# Patient Record
Sex: Female | Born: 1995 | Race: White | Hispanic: No | Marital: Married
Health system: Southern US, Community
[De-identification: ages and names within clinical notes are randomized; demographics above are authoritative.]

## PROBLEM LIST (undated history)

## (undated) DIAGNOSIS — Z9889 Other specified postprocedural states: Secondary | ICD-10-CM

## (undated) DIAGNOSIS — O24419 Gestational diabetes mellitus in pregnancy, unspecified control: Secondary | ICD-10-CM

## (undated) DIAGNOSIS — E119 Type 2 diabetes mellitus without complications: Secondary | ICD-10-CM

## (undated) DIAGNOSIS — I10 Essential (primary) hypertension: Secondary | ICD-10-CM

## (undated) DIAGNOSIS — R112 Nausea with vomiting, unspecified: Secondary | ICD-10-CM

## (undated) DIAGNOSIS — E282 Polycystic ovarian syndrome: Secondary | ICD-10-CM

## (undated) DIAGNOSIS — M5020 Other cervical disc displacement, unspecified cervical region: Secondary | ICD-10-CM

## (undated) DIAGNOSIS — E039 Hypothyroidism, unspecified: Secondary | ICD-10-CM

## (undated) HISTORY — PX: OTHER SURGICAL HISTORY: SHX169

## (undated) HISTORY — PX: NO PAST SURGERIES: SHX2092

---
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
## 2018-06-04 ENCOUNTER — Encounter: Payer: 59 | Attending: Obstetrics & Gynecology | Admitting: Registered"

## 2018-06-04 DIAGNOSIS — E119 Type 2 diabetes mellitus without complications: Secondary | ICD-10-CM

## 2018-06-04 DIAGNOSIS — Z713 Dietary counseling and surveillance: Secondary | ICD-10-CM | POA: Insufficient documentation

## 2018-06-04 DIAGNOSIS — E282 Polycystic ovarian syndrome: Secondary | ICD-10-CM | POA: Insufficient documentation

## 2018-06-04 NOTE — Patient Instructions (Signed)
Consider checking your blood sugar at multiple times during the day Fasting Before dinner 2 hours after dinner 2-3 x week during the night when you wake up If you are having low blood sugar symptoms.   Aim to get more nutrition during the day, use MyPlate to help get balance  Call doctor for prescription letting her know I asked you to check 3-4x day.

## 2018-06-04 NOTE — Progress Notes (Signed)
Diabetes Self-Management Education  Visit Type: First/Initial  Appt. Start Time: 0945 Appt. End Time: 1045  06/07/2018  Melissa Mullins, identified by name and date of birth, is a 22 y.o. female with a diagnosis of Diabetes: Type 2.   ASSESSMENT Visit was originally set up visit for PCOS, trying to conceive. Pt is going to delay trying to conceive until her BG is controlled. Pt states she has been taking 500 mg of metformin since she was diagnosed with PCOS 2 yrs ago. Pt states her dose was increased to 500 mg bid as well as started Jardiance last week when diagnosed with T2DM with an A1c of 9%. Pt states she is waiting until BG gets under control before trying to conceive.  Pt states her doctor was prompted to get an A1c when her POC was 328 mg/dL. Today her blood sugar was 309 mg/dL. Pt states she ate 3/4 c cheerios and milk for breakfast several hours ago, but for the last week has only been eating 2 eggs for breakfast, with a goal of 45 g cho total for the day. Pt states she is getting an endocrinologist referral.  Sleep: wakes up frequently. Pt reports polyuria and polydipsia and states has increased with Jardiance, before was waking up 1-2x per night. ~2 months having hard time going back to sleep. Watching TV before bed. 9:30 in bed, sleep 10:30-11 pm.    Energy Level: states high in the morning 7/10 but low in the evening 4/10. Pt states she works part-time.  Pt states she uses MyFitness Pal app to track carb/pro/fat ratios. Yesterday: 1175 kcal: 80 g pro, 45 g cho, 24g sugar, 75 g fat.  Pt states she has always been larger body. Pt states she did weight watchers for 10 months when she observed her dad losing weight on it, she lost 30 lbs. Pt states she started gaining weight in college and tried [employer] again, but it did not help her lose weight. Pt states she decided to focus on improving health and has tried to maintain regular exercise and eating healthy, specifically states she aims to  consume less than 25g added sugar.  Pt states she gets light headed sometimes in the afternoon when walking around her apartment. Pt states it resolves with rest.   Meter provided: Accu-chek Guide Lot #: [id number] Exp: 01/20/19 BG: 309 mg/dL  Diabetes Self-Management Education - 06/04/18 0849      Visit Information   Visit Type  First/Initial      Initial Visit   Diabetes Type  Type 2    Are you currently following a meal plan?  Yes   45 g carbs/day   What type of meal plan do you follow?  has goal of limited carbs to 45 grams per day, less than 23 g sugar    Are you taking your medications as prescribed?  Yes    Metformin BID, Jardiance 10 mg   Date Diagnosed  Sept 2019      Psychosocial Assessment   Patient Belief/Attitude about Diabetes  Motivated to manage diabetes    How often do you need to have someone help you when you read instructions, pamphlets, or other written materials from your doctor or pharmacy?  1 - Never    What is the last grade level you completed in school?  bachelor's degree      Complications   Last HgB A1C per patient/outside source  9 %   pt provided electronic copy of A1c  How often do you check your blood sugar?  0 times/day (not testing)      Dietary Intake   Breakfast  2 eggs    Snack (morning)  vegetable (celery, PB)    Lunch  protein (chicken)    Snack (afternoon)  none OR almonds    Dinner  protein (steak), vegetable, wild rice    Snack (evening)  none    Beverage(s)  water      Exercise   Exercise Type  Moderate (swimming / aerobic walking)    How many days per week to you exercise?  4    How many minutes per day do you exercise?  45    Total minutes per week of exercise  180      Patient Education   Previous Diabetes Education  No   has been researching on internet   Disease state   Definition of diabetes, type 1 and 2, and the diagnosis of diabetes    Nutrition management   Role of diet in the treatment of diabetes and the  relationship between the three main macronutrients and blood glucose level    Physical activity and exercise   Role of exercise on diabetes management, blood pressure control and cardiac health.    Monitoring  Taught/evaluated SMBG meter.;Purpose and frequency of SMBG.    Acute complications  Taught treatment of hypoglycemia - the 15 rule.    Psychosocial adjustment  Role of stress on diabetes      Individualized Goals (developed by patient)   Nutrition  General guidelines for healthy choices and portions discussed    Medications  take my medication as prescribed   follow-up with endocrinologist   Monitoring   test my blood glucose as discussed      Outcomes   Expected Outcomes  Demonstrated interest in learning. Expect positive outcomes    Future DMSE  PRN    Program Status  Not Completed      Individualized Plan for Diabetes Self-Management Training:   Learning Objective:  Patient will have a greater understanding of diabetes self-management. Patient education plan is to attend individual and/or group sessions per assessed needs and concerns.   Patient Instructions  Consider checking your blood sugar at multiple times during the day Fasting Before dinner 2 hours after dinner 2-3 x week during the night when you wake up If you are having low blood sugar symptoms.   Aim to get more nutrition during the day, use MyPlate to help get balance  Call doctor for prescription letting her know I asked you to check 3-4x day.  Expected Outcomes:  Demonstrated interest in learning. Expect positive outcomes  Education material provided: A1C conversion sheet and My Plate  If problems or questions, patient to contact team via:  Phone  Future DSME appointment: PRN

## 2018-06-07 ENCOUNTER — Telehealth: Payer: Self-pay | Admitting: Registered"

## 2018-06-07 DIAGNOSIS — E119 Type 2 diabetes mellitus without complications: Secondary | ICD-10-CM | POA: Insufficient documentation

## 2018-06-07 NOTE — Telephone Encounter (Signed)
Called to check in with patient to make sure BG has gone down from the 300s seen during office visits. Pt states her blood sugar has has stayed around 200 since she left the office. Pt states FBG is 190-200 mg/dL, before dinner last night was 197, 2 hrs after that meal was 200 mg/dL. (chicken, asparagus, 1/2 c rice). Pt states metformin was increased to 1500 mg and will be increasing Jardiance with next refill soon. Pt states sleep has improved, has added a little more carbs but energy is still very low at night.

## 2018-07-03 ENCOUNTER — Ambulatory Visit: Payer: 59 | Admitting: Registered"

## 2018-10-01 ENCOUNTER — Other Ambulatory Visit: Payer: Self-pay | Admitting: Obstetrics & Gynecology

## 2018-10-01 DIAGNOSIS — Z3141 Encounter for fertility testing: Secondary | ICD-10-CM

## 2018-10-08 ENCOUNTER — Ambulatory Visit (HOSPITAL_COMMUNITY)
Admission: RE | Admit: 2018-10-08 | Discharge: 2018-10-08 | Disposition: A | Discharge status: Home / Self Care | Payer: PRIVATE HEALTH INSURANCE | Source: Ambulatory Visit | Attending: Obstetrics & Gynecology | Admitting: Obstetrics & Gynecology

## 2018-10-08 ENCOUNTER — Encounter (HOSPITAL_COMMUNITY): Payer: Self-pay | Admitting: Radiology

## 2018-10-08 DIAGNOSIS — Z3141 Encounter for fertility testing: Secondary | ICD-10-CM | POA: Insufficient documentation

## 2018-10-08 MED ORDER — IOPAMIDOL (ISOVUE-300) INJECTION 61%
30.0000 mL | Freq: Once | INTRAVENOUS | Status: AC | PRN
  Administered 2018-10-08: 30 mL via INTRAVENOUS

## 2019-07-05 ENCOUNTER — Encounter (INDEPENDENT_AMBULATORY_CARE_PROVIDER_SITE_OTHER): Payer: Self-pay

## 2019-10-09 ENCOUNTER — Other Ambulatory Visit: Payer: Self-pay | Admitting: Endocrinology

## 2019-10-09 DIAGNOSIS — E221 Hyperprolactinemia: Secondary | ICD-10-CM

## 2019-10-29 ENCOUNTER — Encounter: Payer: Self-pay | Admitting: Registered"

## 2019-10-29 ENCOUNTER — Other Ambulatory Visit: Payer: Self-pay

## 2019-10-29 ENCOUNTER — Encounter: Payer: BC Managed Care – PPO | Attending: Endocrinology | Admitting: Registered"

## 2019-10-29 DIAGNOSIS — E1165 Type 2 diabetes mellitus with hyperglycemia: Secondary | ICD-10-CM | POA: Diagnosis present

## 2019-10-29 NOTE — Progress Notes (Signed)
Diabetes Self-Management Education  Visit Type: First/Initial  Appt. Start Time: 1400 Appt. End Time: 1500  11/04/2019  Melissa Mullins, identified by name and date of birth, is a 24 y.o. female with a diagnosis of Diabetes: Type 2.   ASSESSMENT  There were no vitals taken for this visit. There is no height or weight on file to calculate BMI.  Patient is taking pre-natal vitamins and preparing for pregnancy. Pt reports her MD wants her blood sugar better controlled before becoming pregnant. RD discussed Ovasitol for improved ovulation and egg quality. Pt states she is scheduled for IVF treatment in ~3 weeks.  Medication: metformin, Jardiance, Levemir. RD discussed Omnipod and provided form for Representative to call her with more information.  Pt states she does not have typical hunger cues during most of the day but eats to avoid getting headaches. Pt states she is usually very hungry at dinner time.  Labs: Free Testosterone 6.7 (0.0-4.2 pg/mL); testosterone, serum 72 (8-48 ng/dL); TSH 4.58 (0.99-8.3 UIU/mL)  Diabetes Self-Management Education - 11/04/19 1359      Visit Information   Visit Type  First/Initial      Initial Visit   Diabetes Type  Type 2    Are you currently following a meal plan?  Yes    What type of meal plan do you follow?  balanced carb    Are you taking your medications as prescribed?  Yes   Jardiance, Levemir 70 u bid     Complications   Last HgB A1C per patient/outside source  7.5 %    How often do you check your blood sugar?  3-4 times/day    Fasting Blood glucose range (mg/dL)  38-250   539-767   Postprandial Blood glucose range (mg/dL)  341-937   902-409     Dietary Intake   Breakfast  coffee, sugar-free creamer, protein bar (not hungry)    Snack (morning)  apple, PB    Lunch  cereal bar (Fiber One) OR chicken, sweet potato    Dinner  Protein, starch, vegetable    Beverage(s)  water      Exercise   Exercise Type  Light (walking / raking  leaves)    How many days per week to you exercise?  7    How many minutes per day do you exercise?  45    Total minutes per week of exercise  315      Patient Education   Previous Diabetes Education  Yes (please comment)    Nutrition management   Role of diet in the treatment of diabetes and the relationship between the three main macronutrients and blood glucose level    Physical activity and exercise   Role of exercise on diabetes management, blood pressure control and cardiac health.    Medications  Reviewed patients medication for diabetes, action, purpose, timing of dose and side effects.    Preconception care  Pregnancy and GDM  Role of pre-pregnancy blood glucose control on the development of the fetus;Other (comment)   PCOS effect on pregnancy     Individualized Goals (developed by patient)   Nutrition  General guidelines for healthy choices and portions discussed    Medications  take my medication as prescribed;Other (comment)   consider Ovasitol     Outcomes   Expected Outcomes  Demonstrated interest in learning. Expect positive outcomes    Future DMSE  PRN    Program Status  Completed     Patient reports she has been  taking Iron 65 mg supplement x7 yrs and annual lab work shows iron levels WNL.    Individualized Plan for Diabetes Self-Management Training:   Learning Objective:  Patient will have a greater understanding of diabetes self-management. Patient education plan is to attend individual and/or group sessions per assessed needs and concerns.    Patient Instructions  Ask your doctor about the necessity for the iron supplement Aim to eat a diet with foods rich in iron and folate, use handout for ideas. Many women with PCOS have low vitamin D levels, consider asking if your doctor could check those levels too. Ovasitol may help with PCOS symptoms including insulin resistance. Can take while also taking metformin.  Before taking supplements, check the NIH  website: Resource for checking supplements: https://ods.CheapSyrup.pl  Consider having a snack before dinner time so you can have a smaller size dinner. Can try a snack before bedtime to see if that can help reduce your morning fasting blood sugar reading.   Expected Outcomes:  Demonstrated interest in learning. Expect positive outcomes  Education material provided: Engineer, mining, MyPlate for Moms  If problems or questions, patient to contact team via:  Phone  Future DSME appointment: PRN

## 2019-10-29 NOTE — Patient Instructions (Addendum)
Ask your doctor about the necessity for the iron supplement Aim to eat a diet with foods rich in iron and folate, use hadnout for ideas. Many women with PCOS have low vitamin D levels, consider asking if your doctor could check those levels too. Ovasitol may help with PCOS symptoms including insulin resistance. Can take while also taking metformin.  Before taking supplements, check the NIH website: Resource for checking supplements: https://ods.http://potts.com/  Consider having a snack before dinner time so you can have a smaller size dinner. Can try a snack before bedtime to see if that can help reduce your morning fasting blood sugar reading.

## 2019-10-31 ENCOUNTER — Other Ambulatory Visit: Payer: BC Managed Care – PPO

## 2019-11-25 ENCOUNTER — Ambulatory Visit
Admission: RE | Admit: 2019-11-25 | Discharge: 2019-11-25 | Disposition: A | Discharge status: Home / Self Care | Payer: BC Managed Care – PPO | Source: Ambulatory Visit | Attending: Endocrinology | Admitting: Endocrinology

## 2019-11-25 DIAGNOSIS — E221 Hyperprolactinemia: Secondary | ICD-10-CM

## 2019-11-25 MED ORDER — GADOBENATE DIMEGLUMINE 529 MG/ML IV SOLN
10.0000 mL | Freq: Once | INTRAVENOUS | Status: AC | PRN
  Administered 2019-11-25: 10 mL via INTRAVENOUS

## 2020-01-29 LAB — OB RESULTS CONSOLE ANTIBODY SCREEN: Antibody Screen: NEGATIVE

## 2020-01-29 LAB — OB RESULTS CONSOLE RUBELLA ANTIBODY, IGM: Rubella: IMMUNE

## 2020-01-29 LAB — OB RESULTS CONSOLE HIV ANTIBODY (ROUTINE TESTING): HIV: NONREACTIVE

## 2020-01-29 LAB — OB RESULTS CONSOLE GC/CHLAMYDIA
Chlamydia: NEGATIVE
Gonorrhea: NEGATIVE

## 2020-01-29 LAB — OB RESULTS CONSOLE ABO/RH: RH Type: POSITIVE

## 2020-01-29 LAB — OB RESULTS CONSOLE RPR: RPR: NONREACTIVE

## 2020-01-29 LAB — OB RESULTS CONSOLE HEPATITIS B SURFACE ANTIGEN: Hepatitis B Surface Ag: NEGATIVE

## 2020-07-13 DIAGNOSIS — Z3A32 32 weeks gestation of pregnancy: Secondary | ICD-10-CM | POA: Diagnosis not present

## 2020-07-13 DIAGNOSIS — O24113 Pre-existing diabetes mellitus, type 2, in pregnancy, third trimester: Secondary | ICD-10-CM | POA: Diagnosis not present

## 2020-07-16 DIAGNOSIS — O99891 Other specified diseases and conditions complicating pregnancy: Secondary | ICD-10-CM | POA: Diagnosis not present

## 2020-07-20 DIAGNOSIS — Z3A33 33 weeks gestation of pregnancy: Secondary | ICD-10-CM | POA: Diagnosis not present

## 2020-07-20 DIAGNOSIS — O99891 Other specified diseases and conditions complicating pregnancy: Secondary | ICD-10-CM | POA: Diagnosis not present

## 2020-07-23 DIAGNOSIS — Z3A33 33 weeks gestation of pregnancy: Secondary | ICD-10-CM | POA: Diagnosis not present

## 2020-07-23 DIAGNOSIS — O24913 Unspecified diabetes mellitus in pregnancy, third trimester: Secondary | ICD-10-CM | POA: Diagnosis not present

## 2020-07-27 DIAGNOSIS — O24913 Unspecified diabetes mellitus in pregnancy, third trimester: Secondary | ICD-10-CM | POA: Diagnosis not present

## 2020-07-27 DIAGNOSIS — Z3A34 34 weeks gestation of pregnancy: Secondary | ICD-10-CM | POA: Diagnosis not present

## 2020-07-30 DIAGNOSIS — O99891 Other specified diseases and conditions complicating pregnancy: Secondary | ICD-10-CM | POA: Diagnosis not present

## 2020-07-30 DIAGNOSIS — Z3A34 34 weeks gestation of pregnancy: Secondary | ICD-10-CM | POA: Diagnosis not present

## 2020-08-03 DIAGNOSIS — Z3A35 35 weeks gestation of pregnancy: Secondary | ICD-10-CM | POA: Diagnosis not present

## 2020-08-03 DIAGNOSIS — O24119 Pre-existing diabetes mellitus, type 2, in pregnancy, unspecified trimester: Secondary | ICD-10-CM | POA: Diagnosis not present

## 2020-08-04 DIAGNOSIS — E02 Subclinical iodine-deficiency hypothyroidism: Secondary | ICD-10-CM | POA: Diagnosis not present

## 2020-08-04 DIAGNOSIS — Z331 Pregnant state, incidental: Secondary | ICD-10-CM | POA: Diagnosis not present

## 2020-08-04 DIAGNOSIS — E1165 Type 2 diabetes mellitus with hyperglycemia: Secondary | ICD-10-CM | POA: Diagnosis not present

## 2020-08-04 DIAGNOSIS — Z3685 Encounter for antenatal screening for Streptococcus B: Secondary | ICD-10-CM | POA: Diagnosis not present

## 2020-08-04 LAB — OB RESULTS CONSOLE GBS: GBS: NEGATIVE

## 2020-08-07 ENCOUNTER — Encounter (HOSPITAL_COMMUNITY): Payer: Self-pay | Admitting: Obstetrics & Gynecology

## 2020-08-07 ENCOUNTER — Inpatient Hospital Stay (HOSPITAL_COMMUNITY)
Admission: AD | Admit: 2020-08-07 | Discharge: 2020-08-07 | Disposition: A | Discharge status: Home / Self Care | Payer: BC Managed Care – PPO | Attending: Obstetrics & Gynecology | Admitting: Obstetrics & Gynecology

## 2020-08-07 ENCOUNTER — Other Ambulatory Visit: Payer: Self-pay

## 2020-08-07 DIAGNOSIS — Z3A36 36 weeks gestation of pregnancy: Secondary | ICD-10-CM | POA: Diagnosis not present

## 2020-08-07 DIAGNOSIS — Z3689 Encounter for other specified antenatal screening: Secondary | ICD-10-CM

## 2020-08-07 DIAGNOSIS — O24113 Pre-existing diabetes mellitus, type 2, in pregnancy, third trimester: Secondary | ICD-10-CM | POA: Insufficient documentation

## 2020-08-07 DIAGNOSIS — Z794 Long term (current) use of insulin: Secondary | ICD-10-CM | POA: Diagnosis not present

## 2020-08-07 NOTE — Discharge Instructions (Signed)
Fetal Movement Counts Patient Name: ________________________________________________ Patient Due Date: ____________________ What is a fetal movement count?  A fetal movement count is the number of times that you feel your baby move during a certain amount of time. This may also be called a fetal kick count. A fetal movement count is recommended for every pregnant woman. You may be asked to start counting fetal movements as early as week 28 of your pregnancy. Pay attention to when your baby is most active. You may notice your baby's sleep and wake cycles. You may also notice things that make your baby move more. You should do a fetal movement count:  When your baby is normally most active.  At the same time each day. A good time to count movements is while you are resting, after having something to eat and drink. How do I count fetal movements? 1. Find a quiet, comfortable area. Sit, or lie down on your side. 2. Write down the date, the start time and stop time, and the number of movements that you felt between those two times. Take this information with you to your health care visits. 3. Write down your start time when you feel the first movement. 4. Count kicks, flutters, swishes, rolls, and jabs. You should feel at least 10 movements. 5. You may stop counting after you have felt 10 movements, or if you have been counting for 2 hours. Write down the stop time. 6. If you do not feel 10 movements in 2 hours, contact your health care provider for further instructions. Your health care provider may want to do additional tests to assess your baby's well-being. Contact a health care provider if:  You feel fewer than 10 movements in 2 hours.  Your baby is not moving like he or she usually does. Date: ____________ Start time: ____________ Stop time: ____________ Movements: ____________ Date: ____________ Start time: ____________ Stop time: ____________ Movements: ____________ Date: ____________  Start time: ____________ Stop time: ____________ Movements: ____________ Date: ____________ Start time: ____________ Stop time: ____________ Movements: ____________ Date: ____________ Start time: ____________ Stop time: ____________ Movements: ____________ Date: ____________ Start time: ____________ Stop time: ____________ Movements: ____________ Date: ____________ Start time: ____________ Stop time: ____________ Movements: ____________ Date: ____________ Start time: ____________ Stop time: ____________ Movements: ____________ Date: ____________ Start time: ____________ Stop time: ____________ Movements: ____________ This information is not intended to replace advice given to you by your health care provider. Make sure you discuss any questions you have with your health care provider. Document Revised: 04/18/2019 Document Reviewed: 04/18/2019 Elsevier Patient Education  2020 [employer].  

## 2020-08-07 NOTE — MAU Provider Note (Signed)
First Provider Initiated Contact with Patient 08/07/20 [phone]       S: Ms. Margreat Widener is a 24 y.o. G1P0 at [redacted]w[redacted]d  who presents to MAU today for NST since her OB office is closed for the holiday. She denies ctx, LOF, or VB. Reports good FM. Her pregnancy is complicated by T2DM on Insulin, IVF pregnancy, and Hypothyroidism.  O: BP 131/74   Pulse 86   Temp (!) 97.5 F (36.4 C) (Oral)   Resp 18  GENERAL: Well-developed, well-nourished female in no acute distress.  HEAD: Normocephalic, atraumatic.  CHEST: Normal effort of breathing, regular heart rate ABDOMEN: Soft, nontender, gravid  Fetal Monitoring: Baseline: 140 Variability: mod Accelerations: + Decelerations: no Contractions: none  A: 1. [redacted] weeks gestation of pregnancy   2. NST (non-stress test) reactive   3. Pregnancy with type 2 diabetes mellitus in third trimester    P: Discharge home Follow up at [facility] as scheduled FMCs  Allergies as of 08/07/2020      Reactions   Penicillins    Sulfa Antibiotics       Medication List    TAKE these medications   Jardiance 10 MG Tabs tablet Generic drug: empagliflozin Take 10 mg by mouth daily.   LEVEMIR FLEXPEN [place] Inject 70 Units into the skin in the morning and at bedtime.   levothyroxine 25 MCG tablet Commonly known as: SYNTHROID Take 75 mcg by mouth daily before breakfast.   metFORMIN 500 MG tablet Commonly known as: GLUCOPHAGE Take by mouth 2 (two) times daily with a meal.   multivitamin-prenatal 27-0.8 MG Tabs tablet Take 1 tablet by mouth daily at 12 noon.      Donette Larry, CNM 08/07/2020 8:48 AM

## 2020-08-07 NOTE — MAU Note (Signed)
Pt getting bi-weekly NST for type 2 DM.  No complaints.

## 2020-08-07 NOTE — Progress Notes (Signed)
Office closed due to holiday.  Pt has been getting bi-weekly NST due to Type 2 DM. No complaints.  Reports +FM

## 2020-08-09 ENCOUNTER — Encounter (HOSPITAL_COMMUNITY): Payer: Self-pay | Admitting: Obstetrics & Gynecology

## 2020-08-09 ENCOUNTER — Other Ambulatory Visit: Payer: Self-pay

## 2020-08-09 ENCOUNTER — Inpatient Hospital Stay (HOSPITAL_COMMUNITY)
Admission: AD | Admit: 2020-08-09 | Discharge: 2020-08-09 | Disposition: A | Discharge status: Home / Self Care | Payer: BC Managed Care – PPO | Attending: Obstetrics & Gynecology | Admitting: Obstetrics & Gynecology

## 2020-08-09 DIAGNOSIS — Z7984 Long term (current) use of oral hypoglycemic drugs: Secondary | ICD-10-CM | POA: Diagnosis not present

## 2020-08-09 DIAGNOSIS — Z3A00 Weeks of gestation of pregnancy not specified: Secondary | ICD-10-CM | POA: Diagnosis not present

## 2020-08-09 DIAGNOSIS — R030 Elevated blood-pressure reading, without diagnosis of hypertension: Secondary | ICD-10-CM

## 2020-08-09 DIAGNOSIS — Z79890 Hormone replacement therapy (postmenopausal): Secondary | ICD-10-CM | POA: Diagnosis not present

## 2020-08-09 DIAGNOSIS — O24113 Pre-existing diabetes mellitus, type 2, in pregnancy, third trimester: Secondary | ICD-10-CM | POA: Diagnosis not present

## 2020-08-09 DIAGNOSIS — Z3A36 36 weeks gestation of pregnancy: Secondary | ICD-10-CM | POA: Diagnosis not present

## 2020-08-09 DIAGNOSIS — O99891 Other specified diseases and conditions complicating pregnancy: Secondary | ICD-10-CM

## 2020-08-09 DIAGNOSIS — Z794 Long term (current) use of insulin: Secondary | ICD-10-CM | POA: Insufficient documentation

## 2020-08-09 DIAGNOSIS — O133 Gestational [pregnancy-induced] hypertension without significant proteinuria, third trimester: Secondary | ICD-10-CM | POA: Insufficient documentation

## 2020-08-09 HISTORY — DX: Essential (primary) hypertension: I10

## 2020-08-09 HISTORY — DX: Type 2 diabetes mellitus without complications: E11.9

## 2020-08-09 LAB — URINALYSIS, ROUTINE W REFLEX MICROSCOPIC
Bilirubin Urine: NEGATIVE
Glucose, UA: NEGATIVE mg/dL
Hgb urine dipstick: NEGATIVE
Ketones, ur: NEGATIVE mg/dL
Leukocytes,Ua: NEGATIVE
Nitrite: NEGATIVE
Protein, ur: NEGATIVE mg/dL
Specific Gravity, Urine: 1.017 (ref 1.005–1.030)
pH: 5 (ref 5.0–8.0)

## 2020-08-09 LAB — CBC
HCT: 36.7 % (ref 36.0–46.0)
Hemoglobin: 12.3 g/dL (ref 12.0–15.0)
MCH: 29.9 pg (ref 26.0–34.0)
MCHC: 33.5 g/dL (ref 30.0–36.0)
MCV: 89.1 fL (ref 80.0–100.0)
Platelets: 262 10*3/uL (ref 150–400)
RBC: 4.12 MIL/uL (ref 3.87–5.11)
RDW: 13.4 % (ref 11.5–15.5)
WBC: 11.9 10*3/uL — ABNORMAL HIGH (ref 4.0–10.5)
nRBC: 0 % (ref 0.0–0.2)

## 2020-08-09 LAB — COMPREHENSIVE METABOLIC PANEL
ALT: 35 U/L (ref 0–44)
AST: 24 U/L (ref 15–41)
Albumin: 2.6 g/dL — ABNORMAL LOW (ref 3.5–5.0)
Alkaline Phosphatase: 78 U/L (ref 38–126)
Anion gap: 10 (ref 5–15)
BUN: 7 mg/dL (ref 6–20)
CO2: 24 mmol/L (ref 22–32)
Calcium: 8.9 mg/dL (ref 8.9–10.3)
Chloride: 104 mmol/L (ref 98–111)
Creatinine, Ser: 0.9 mg/dL (ref 0.44–1.00)
GFR, Estimated: >60 mL/min (ref >60)
Glucose, Bld: 166 mg/dL — ABNORMAL HIGH (ref 70–99)
Potassium: 3.7 mmol/L (ref 3.5–5.1)
Sodium: 138 mmol/L (ref 135–145)
Total Bilirubin: 0.3 mg/dL (ref 0.3–1.2)
Total Protein: 6.2 g/dL — ABNORMAL LOW (ref 6.5–8.1)

## 2020-08-09 LAB — PROTEIN / CREATININE RATIO, URINE
Creatinine, Urine: 149.59 mg/dL
Protein Creatinine Ratio: 0.05 mg/mg{Cre} (ref 0.00–0.15)
Total Protein, Urine: 7 mg/dL

## 2020-08-09 NOTE — MAU Provider Note (Signed)
Patient Melissa Mullins is a 24 y.o.  G1P0  at G1P0 here with complaints of elevated BP reading at home x 1. She denies blurry vision, HA, floating spots, decreased fetal movement, LOF, contractions, RUQ pain.   She is a Type 2 DM; has been receiving weekly NST at [facility]. She reports that she was on her feet today and she took her BP, and it was elevated.  History     CSN: [id number]  Arrival date and time: 08/09/20 2034   None     Chief Complaint  Patient presents with  . Hypertension   Hypertension This is a new problem. The current episode started today. The problem is unchanged. Pertinent negatives include no blurred vision, chest pain, headaches or shortness of breath.  She reports that her BP at home was 148/107. She denies any history of blood pressure issues in this pregnancy. She took her BP "just to see" what it is and she thinks its elevated because she was on her feet a lot today.   OB History    Gravida  1   Para      Term      Preterm      AB      Living        SAB      TAB      Ectopic      Multiple      Live Births              Past Medical History:  Diagnosis Date  . Diabetes mellitus without complication (HCC)    Type 2 Diagnosed in 2019  . Hypertension    Pt reports intermittent elevation-not severe range following BP;s at home x 2 weeks    Past Surgical History:  Procedure Laterality Date  . NO PAST SURGERIES      History reviewed. No pertinent family history.  Social History   Tobacco Use  . Smoking status: Never Smoker  . Smokeless tobacco: Never Used  Substance Use Topics  . Alcohol use: Never  . Drug use: Never    Allergies:  Allergies  Allergen Reactions  . Penicillins Hives    Pt reports as a child has avoided drug since  . Sulfa Antibiotics Hives    Pt reports reaction as a child-has avoided medication since    Medications Prior to Admission  Medication Sig Dispense Refill Last Dose  . insulin  aspart (NOVOLOG) 100 UNIT/ML injection Inject 30 Units into the skin 3 (three) times daily before meals.   08/09/2020 at 1830  . Insulin Detemir (LEVEMIR FLEXPEN [place]) Inject 65 Units into the skin in the morning.    08/09/2020 at Unknown time  . insulin detemir (LEVEMIR) 100 UNIT/ML injection Inject 40 Units into the skin at bedtime. If BS less than 100 pt to hold Levemir if above 100 takes 40units   08/08/2020 at Unknown time  . levothyroxine (SYNTHROID, LEVOTHROID) 25 MCG tablet Take 75 mcg by mouth daily before breakfast.    08/09/2020 at Unknown time  . metFORMIN (GLUCOPHAGE) 500 MG tablet Take 1,000 mg by mouth 2 (two) times daily with a meal.    08/09/2020 at 1930  . Prenatal Vit-Fe Fumarate-FA (MULTIVITAMIN-PRENATAL) 27-0.8 MG TABS tablet Take 1 tablet by mouth daily at 12 noon.   08/08/2020 at Unknown time  . empagliflozin (JARDIANCE) 10 MG TABS tablet Take 10 mg by mouth daily.       Review of Systems  Eyes: Negative  for blurred vision.  Respiratory: Negative for shortness of breath.   Cardiovascular: Negative for chest pain.  Neurological: Negative for headaches.   Physical Exam   Blood pressure (!) 146/83, pulse 79, temperature 98.4 F (36.9 C), resp. rate 18, height 5\' 2"  (1.575 m), weight 105.2 kg.  Physical Exam Constitutional:      Appearance: Normal appearance.  Pulmonary:     Effort: Pulmonary effort is normal.  Abdominal:     General: Abdomen is flat.  Musculoskeletal:        General: Normal range of motion.  Neurological:     Mental Status: She is alert.     MAU Course  Procedures  MDM -NST 135 bpm, mod var, present acel, neg decels, no contractions  -CBC, CMP are reassuring, no indiciation of pre-e or pre-e with severe features -PCR: 0.05  Patient Vitals for the past 24 hrs:  BP Temp Temp src Pulse Resp SpO2 Height Weight  08/09/20 2343 - 98.3 F (36.8 C) Oral - - - - -  08/09/20 2326 - - - - - 100 % - -  08/09/20 2321 - - - - - 100 % - -  08/09/20  2317 117/75 - - 84 - - - -  08/09/20 2316 - - - - - 99 % - -  08/09/20 2311 - - - - - 100 % - -  08/09/20 2306 - - - - - 99 % - -  08/09/20 2302 118/68 - - 91 - - - -  08/09/20 2301 - - - - - 99 % - -  08/09/20 2256 - - - - - 100 % - -  08/09/20 2251 - - - - - 100 % - -  08/09/20 2141 - - - - - 99 % - -  08/09/20 2136 - - - - - 98 % - -  08/09/20 2131 131/82 - - 89 17 97 % - -  08/09/20 2126 - - - - - 98 % - -  08/09/20 2121 - - - - - 97 % - -  08/09/20 2116 136/80 - - 82 - 97 % - -  08/09/20 2111 128/79 98.6 F (37 C) Oral 84 17 97 % - -  08/09/20 2055 (!) 146/83 98.4 F (36.9 C) - 79 18 - 5\' 2"  (1.575 m) 105.2 kg   Blood pressure taken over many hours while in MAU; had one elevated Bp upon arrival and then none after that. Explained to patient that she might meet criteria for GHT/pre-e if she continues to have elevated BPs  Assessment and Plan   1. Transient hypertension    -Patient has appt on Tuesday; will check BP again on Tuesday at that visit.  -reviewed warning signs and when to return to MAU, including signs of pre-eclampsia (HA, blurry vision, elevated blood pressure at home, RUQ pain, floating spots) -Dr. Tuesday aware of patient's MAU visit and that patient will follow up in the office.   Thursday Kooistra 08/09/2020, 9:39 PM

## 2020-08-09 NOTE — MAU Note (Signed)
Pt denies HA, visual changes or epigastric pain. Pt is Type 2 Diabetic diagnosed in 2019. Pt was seen in the office last Monday for ultrasound for growth. Dr.Tomblin to repeat US on Tuesday to follow AF level. SVE in office. /closed and soft per pt report.

## 2020-08-09 NOTE — Discharge Instructions (Signed)

## 2020-08-09 NOTE — MAU Note (Signed)
Pt took b/p tonight and it was 148/107. Has GDM and has NST biweekly. Good fetal movment reported. No headache tonight but has been having headaches  off and on all week

## 2020-08-11 DIAGNOSIS — O24113 Pre-existing diabetes mellitus, type 2, in pregnancy, third trimester: Secondary | ICD-10-CM | POA: Diagnosis not present

## 2020-08-11 DIAGNOSIS — Z3A36 36 weeks gestation of pregnancy: Secondary | ICD-10-CM | POA: Diagnosis not present

## 2020-08-13 ENCOUNTER — Telehealth (HOSPITAL_COMMUNITY): Payer: Self-pay | Admitting: *Deleted

## 2020-08-13 ENCOUNTER — Other Ambulatory Visit (HOSPITAL_COMMUNITY)
Admission: RE | Admit: 2020-08-13 | Discharge: 2020-08-13 | Disposition: A | Discharge status: Home / Self Care | Payer: BC Managed Care – PPO | Source: Ambulatory Visit | Attending: Obstetrics and Gynecology | Admitting: Obstetrics and Gynecology

## 2020-08-13 DIAGNOSIS — Z3A37 37 weeks gestation of pregnancy: Secondary | ICD-10-CM | POA: Diagnosis not present

## 2020-08-13 DIAGNOSIS — E119 Type 2 diabetes mellitus without complications: Secondary | ICD-10-CM | POA: Diagnosis not present

## 2020-08-13 DIAGNOSIS — O134 Gestational [pregnancy-induced] hypertension without significant proteinuria, complicating childbirth: Secondary | ICD-10-CM | POA: Diagnosis not present

## 2020-08-13 DIAGNOSIS — O2412 Pre-existing diabetes mellitus, type 2, in childbirth: Secondary | ICD-10-CM | POA: Diagnosis not present

## 2020-08-13 DIAGNOSIS — O99284 Endocrine, nutritional and metabolic diseases complicating childbirth: Secondary | ICD-10-CM | POA: Diagnosis not present

## 2020-08-13 DIAGNOSIS — Z20822 Contact with and (suspected) exposure to covid-19: Secondary | ICD-10-CM | POA: Diagnosis not present

## 2020-08-13 DIAGNOSIS — O99891 Other specified diseases and conditions complicating pregnancy: Secondary | ICD-10-CM | POA: Diagnosis not present

## 2020-08-13 DIAGNOSIS — Z23 Encounter for immunization: Secondary | ICD-10-CM | POA: Diagnosis not present

## 2020-08-13 DIAGNOSIS — O4103X0 Oligohydramnios, third trimester, not applicable or unspecified: Secondary | ICD-10-CM | POA: Diagnosis not present

## 2020-08-13 DIAGNOSIS — E039 Hypothyroidism, unspecified: Secondary | ICD-10-CM | POA: Diagnosis not present

## 2020-08-13 DIAGNOSIS — Z794 Long term (current) use of insulin: Secondary | ICD-10-CM | POA: Diagnosis not present

## 2020-08-13 DIAGNOSIS — Z3A36 36 weeks gestation of pregnancy: Secondary | ICD-10-CM | POA: Diagnosis not present

## 2020-08-13 DIAGNOSIS — Z01812 Encounter for preprocedural laboratory examination: Secondary | ICD-10-CM | POA: Insufficient documentation

## 2020-08-13 DIAGNOSIS — Z833 Family history of diabetes mellitus: Secondary | ICD-10-CM | POA: Diagnosis not present

## 2020-08-13 DIAGNOSIS — Z0542 Observation and evaluation of newborn for suspected metabolic condition ruled out: Secondary | ICD-10-CM | POA: Diagnosis not present

## 2020-08-13 DIAGNOSIS — O99214 Obesity complicating childbirth: Secondary | ICD-10-CM | POA: Diagnosis not present

## 2020-08-13 LAB — SARS CORONAVIRUS 2 (TAT 6-24 HRS): SARS Coronavirus 2: NEGATIVE

## 2020-08-13 NOTE — Telephone Encounter (Signed)
Preadmission screen  

## 2020-08-14 ENCOUNTER — Inpatient Hospital Stay (HOSPITAL_COMMUNITY): Payer: BC Managed Care – PPO

## 2020-08-14 ENCOUNTER — Inpatient Hospital Stay (HOSPITAL_COMMUNITY)
Admission: AD | Admit: 2020-08-14 | Discharge: 2020-08-18 | DRG: 787 | Disposition: A | Discharge status: Home / Self Care | Payer: BC Managed Care – PPO | Attending: Obstetrics and Gynecology | Admitting: Obstetrics and Gynecology

## 2020-08-14 ENCOUNTER — Inpatient Hospital Stay (HOSPITAL_COMMUNITY): Payer: BC Managed Care – PPO | Admitting: Anesthesiology

## 2020-08-14 ENCOUNTER — Other Ambulatory Visit: Payer: Self-pay

## 2020-08-14 ENCOUNTER — Encounter (HOSPITAL_COMMUNITY)
Admission: AD | Disposition: A | Discharge status: Home / Self Care | Payer: Self-pay | Source: Home / Self Care | Attending: Obstetrics and Gynecology

## 2020-08-14 ENCOUNTER — Encounter (HOSPITAL_COMMUNITY): Payer: Self-pay | Admitting: Obstetrics and Gynecology

## 2020-08-14 DIAGNOSIS — O2412 Pre-existing diabetes mellitus, type 2, in childbirth: Secondary | ICD-10-CM | POA: Diagnosis present

## 2020-08-14 DIAGNOSIS — Z20822 Contact with and (suspected) exposure to covid-19: Secondary | ICD-10-CM | POA: Diagnosis present

## 2020-08-14 DIAGNOSIS — Z3A37 37 weeks gestation of pregnancy: Secondary | ICD-10-CM | POA: Diagnosis not present

## 2020-08-14 DIAGNOSIS — O4103X0 Oligohydramnios, third trimester, not applicable or unspecified: Secondary | ICD-10-CM | POA: Diagnosis present

## 2020-08-14 DIAGNOSIS — O99214 Obesity complicating childbirth: Secondary | ICD-10-CM | POA: Diagnosis present

## 2020-08-14 DIAGNOSIS — E039 Hypothyroidism, unspecified: Secondary | ICD-10-CM | POA: Diagnosis present

## 2020-08-14 DIAGNOSIS — O99284 Endocrine, nutritional and metabolic diseases complicating childbirth: Secondary | ICD-10-CM | POA: Diagnosis present

## 2020-08-14 DIAGNOSIS — E119 Type 2 diabetes mellitus without complications: Secondary | ICD-10-CM | POA: Diagnosis present

## 2020-08-14 DIAGNOSIS — Z3490 Encounter for supervision of normal pregnancy, unspecified, unspecified trimester: Secondary | ICD-10-CM | POA: Diagnosis present

## 2020-08-14 DIAGNOSIS — O134 Gestational [pregnancy-induced] hypertension without significant proteinuria, complicating childbirth: Principal | ICD-10-CM | POA: Diagnosis present

## 2020-08-14 DIAGNOSIS — Z794 Long term (current) use of insulin: Secondary | ICD-10-CM

## 2020-08-14 DIAGNOSIS — Z98891 History of uterine scar from previous surgery: Secondary | ICD-10-CM

## 2020-08-14 LAB — GLUCOSE, CAPILLARY
Glucose-Capillary: 87 mg/dL (ref 70–99)
Glucose-Capillary: 96 mg/dL (ref 70–99)

## 2020-08-14 LAB — TYPE AND SCREEN
ABO/RH(D): AB POS
Antibody Screen: NEGATIVE

## 2020-08-14 LAB — COMPREHENSIVE METABOLIC PANEL
ALT: 27 U/L (ref 0–44)
AST: 21 U/L (ref 15–41)
Albumin: 2.7 g/dL — ABNORMAL LOW (ref 3.5–5.0)
Alkaline Phosphatase: 89 U/L (ref 38–126)
Anion gap: 9 (ref 5–15)
BUN: 6 mg/dL (ref 6–20)
CO2: 22 mmol/L (ref 22–32)
Calcium: 8.7 mg/dL — ABNORMAL LOW (ref 8.9–10.3)
Chloride: 106 mmol/L (ref 98–111)
Creatinine, Ser: 0.74 mg/dL (ref 0.44–1.00)
GFR, Estimated: >60 mL/min (ref >60)
Glucose, Bld: 110 mg/dL — ABNORMAL HIGH (ref 70–99)
Potassium: 3.7 mmol/L (ref 3.5–5.1)
Sodium: 137 mmol/L (ref 135–145)
Total Bilirubin: 0.4 mg/dL (ref 0.3–1.2)
Total Protein: 6.2 g/dL — ABNORMAL LOW (ref 6.5–8.1)

## 2020-08-14 LAB — PROTEIN / CREATININE RATIO, URINE
Creatinine, Urine: 55.59 mg/dL
Protein Creatinine Ratio: 0.18 mg/mg{Cre} — ABNORMAL HIGH (ref 0.00–0.15)
Total Protein, Urine: 10 mg/dL

## 2020-08-14 LAB — CBC
HCT: 40.4 % (ref 36.0–46.0)
HCT: 38.3 % (ref 36.0–46.0)
Hemoglobin: 13.4 g/dL (ref 12.0–15.0)
Hemoglobin: 12.8 g/dL (ref 12.0–15.0)
MCH: 30 pg (ref 26.0–34.0)
MCH: 30 pg (ref 26.0–34.0)
MCHC: 33.2 g/dL (ref 30.0–36.0)
MCHC: 33.4 g/dL (ref 30.0–36.0)
MCV: 90.6 fL (ref 80.0–100.0)
MCV: 89.9 fL (ref 80.0–100.0)
Platelets: 275 10*3/uL (ref 150–400)
Platelets: 254 10*3/uL (ref 150–400)
RBC: 4.46 MIL/uL (ref 3.87–5.11)
RBC: 4.26 MIL/uL (ref 3.87–5.11)
RDW: 13.6 % (ref 11.5–15.5)
RDW: 13.5 % (ref 11.5–15.5)
WBC: 11.7 10*3/uL — ABNORMAL HIGH (ref 4.0–10.5)
WBC: 10.5 10*3/uL (ref 4.0–10.5)
nRBC: 0 % (ref 0.0–0.2)
nRBC: 0 % (ref 0.0–0.2)

## 2020-08-14 LAB — HEMOGLOBIN A1C
Hgb A1c MFr Bld: 4.8 % (ref 4.8–5.6)
Mean Plasma Glucose: 91.06 mg/dL

## 2020-08-14 LAB — RPR: RPR Ser Ql: NONREACTIVE

## 2020-08-14 SURGERY — Surgical Case
Anesthesia: Spinal

## 2020-08-14 MED ORDER — PHENYLEPHRINE 40 MCG/ML (10ML) SYRINGE FOR IV PUSH (FOR BLOOD PRESSURE SUPPORT): 80.0000 ug | PREFILLED_SYRINGE | INTRAVENOUS | Status: DC | PRN

## 2020-08-14 MED ORDER — OXYTOCIN-SODIUM CHLORIDE 30-0.9 UT/500ML-% IV SOLN
1.0000 m[IU]/min | INTRAVENOUS | Status: DC
  Administered 2020-08-14: 2 m[IU]/min via INTRAVENOUS

## 2020-08-14 MED ORDER — OXYTOCIN BOLUS FROM INFUSION: 333.0000 mL | Freq: Once | INTRAVENOUS | Status: DC

## 2020-08-14 MED ORDER — COCONUT OIL OIL: 1.0000 "application " | TOPICAL_OIL | Status: DC | PRN

## 2020-08-14 MED ORDER — INSULIN ASPART 100 UNIT/ML ~~LOC~~ SOLN: 0.0000 [IU] | SUBCUTANEOUS | Status: DC

## 2020-08-14 MED ORDER — LACTATED RINGERS IV SOLN: INTRAVENOUS | Status: DC

## 2020-08-14 MED ORDER — DIPHENHYDRAMINE HCL 25 MG PO CAPS: 25.0000 mg | ORAL_CAPSULE | ORAL | Status: DC | PRN

## 2020-08-14 MED ORDER — SODIUM CHLORIDE 0.9 % IV SOLN
INTRAVENOUS | Status: DC | PRN
  Administered 2020-08-14: 2 g via INTRAVENOUS

## 2020-08-14 MED ORDER — DIPHENHYDRAMINE HCL 50 MG/ML IJ SOLN: 12.5000 mg | INTRAMUSCULAR | Status: DC | PRN

## 2020-08-14 MED ORDER — INSULIN ASPART 100 UNIT/ML ~~LOC~~ SOLN: 8.0000 [IU] | Freq: Three times a day (TID) | SUBCUTANEOUS | Status: DC

## 2020-08-14 MED ORDER — ACETAMINOPHEN 325 MG PO TABS: 650.0000 mg | ORAL_TABLET | ORAL | Status: DC | PRN

## 2020-08-14 MED ORDER — KETOROLAC TROMETHAMINE 30 MG/ML IJ SOLN
30.0000 mg | Freq: Four times a day (QID) | INTRAMUSCULAR | Status: DC | PRN
  Administered 2020-08-14: 30 mg via INTRAMUSCULAR

## 2020-08-14 MED ORDER — DEXTROSE IN LACTATED RINGERS 5 % IV SOLN: INTRAVENOUS | Status: DC

## 2020-08-14 MED ORDER — FENTANYL-BUPIVACAINE-NACL 0.5-0.125-0.9 MG/250ML-% EP SOLN: 12.0000 mL/h | EPIDURAL | Status: DC | PRN

## 2020-08-14 MED ORDER — MISOPROSTOL 25 MCG QUARTER TABLET
25.0000 ug | ORAL_TABLET | ORAL | Status: DC | PRN
  Administered 2020-08-14 (×2): 25 ug via VAGINAL
  Filled 2020-08-14 (×2): qty 1

## 2020-08-14 MED ORDER — ONDANSETRON HCL 4 MG/2ML IJ SOLN: 4.0000 mg | Freq: Three times a day (TID) | INTRAMUSCULAR | Status: DC | PRN

## 2020-08-14 MED ORDER — KETOROLAC TROMETHAMINE 30 MG/ML IJ SOLN: 30.0000 mg | Freq: Once | INTRAMUSCULAR | Status: DC | PRN

## 2020-08-14 MED ORDER — SODIUM CHLORIDE 0.9% FLUSH: 3.0000 mL | INTRAVENOUS | Status: DC | PRN

## 2020-08-14 MED ORDER — PROMETHAZINE HCL 25 MG/ML IJ SOLN
INTRAMUSCULAR | Status: AC
  Filled 2020-08-14: qty 1

## 2020-08-14 MED ORDER — EPHEDRINE 5 MG/ML INJ: 10.0000 mg | INTRAVENOUS | Status: DC | PRN

## 2020-08-14 MED ORDER — NALBUPHINE HCL 10 MG/ML IJ SOLN: 5.0000 mg | INTRAMUSCULAR | Status: DC | PRN

## 2020-08-14 MED ORDER — HYDROMORPHONE HCL 1 MG/ML IJ SOLN: 0.2500 mg | INTRAMUSCULAR | Status: DC | PRN

## 2020-08-14 MED ORDER — PROMETHAZINE HCL 25 MG/ML IJ SOLN: 6.2500 mg | INTRAMUSCULAR | Status: DC | PRN

## 2020-08-14 MED ORDER — LACTATED RINGERS IV SOLN: 500.0000 mL | Freq: Once | INTRAVENOUS | Status: DC

## 2020-08-14 MED ORDER — ACETAMINOPHEN 500 MG PO TABS
1000.0000 mg | ORAL_TABLET | Freq: Four times a day (QID) | ORAL | Status: AC
  Administered 2020-08-14 – 2020-08-15 (×4): 1000 mg via ORAL
  Filled 2020-08-14 (×4): qty 2

## 2020-08-14 MED ORDER — SODIUM CHLORIDE 0.9 % IV SOLN
2.0000 g | INTRAVENOUS | Status: DC
  Filled 2020-08-14: qty 2

## 2020-08-14 MED ORDER — BUPIVACAINE IN DEXTROSE 0.75-8.25 % IT SOLN
INTRATHECAL | Status: DC | PRN
  Administered 2020-08-14: 1.5 mL via INTRATHECAL

## 2020-08-14 MED ORDER — METFORMIN HCL 500 MG PO TABS: 1000.0000 mg | ORAL_TABLET | Freq: Two times a day (BID) | ORAL | Status: DC

## 2020-08-14 MED ORDER — TERBUTALINE SULFATE 1 MG/ML IJ SOLN: 0.2500 mg | Freq: Once | INTRAMUSCULAR | Status: DC | PRN

## 2020-08-14 MED ORDER — OXYCODONE-ACETAMINOPHEN 5-325 MG PO TABS: 1.0000 | ORAL_TABLET | ORAL | Status: DC | PRN

## 2020-08-14 MED ORDER — LIDOCAINE HCL (PF) 1 % IJ SOLN: 30.0000 mL | INTRAMUSCULAR | Status: DC | PRN

## 2020-08-14 MED ORDER — OXYTOCIN-SODIUM CHLORIDE 30-0.9 UT/500ML-% IV SOLN
INTRAVENOUS | Status: DC | PRN
  Administered 2020-08-14: 30 [IU] via INTRAVENOUS

## 2020-08-14 MED ORDER — PHENYLEPHRINE HCL-NACL 20-0.9 MG/250ML-% IV SOLN
INTRAVENOUS | Status: AC
  Filled 2020-08-14: qty 250

## 2020-08-14 MED ORDER — ACETAMINOPHEN 325 MG PO TABS
650.0000 mg | ORAL_TABLET | ORAL | Status: DC | PRN
  Administered 2020-08-18 (×2): 650 mg via ORAL
  Filled 2020-08-14 (×2): qty 2

## 2020-08-14 MED ORDER — SODIUM CHLORIDE 0.9 % IV SOLN
INTRAVENOUS | Status: AC
  Filled 2020-08-14: qty 2

## 2020-08-14 MED ORDER — ZOLPIDEM TARTRATE 5 MG PO TABS: 5.0000 mg | ORAL_TABLET | Freq: Every evening | ORAL | Status: DC | PRN

## 2020-08-14 MED ORDER — NALBUPHINE HCL 10 MG/ML IJ SOLN: 5.0000 mg | Freq: Once | INTRAMUSCULAR | Status: DC | PRN

## 2020-08-14 MED ORDER — SCOPOLAMINE 1 MG/3DAYS TD PT72
1.0000 | MEDICATED_PATCH | Freq: Once | TRANSDERMAL | Status: AC
  Administered 2020-08-14: 1.5 mg via TRANSDERMAL
  Filled 2020-08-14: qty 1

## 2020-08-14 MED ORDER — SIMETHICONE 80 MG PO CHEW: 80.0000 mg | CHEWABLE_TABLET | ORAL | Status: DC | PRN

## 2020-08-14 MED ORDER — NALOXONE HCL 4 MG/10ML IJ SOLN
1.0000 ug/kg/h | INTRAVENOUS | Status: DC | PRN
  Filled 2020-08-14: qty 5

## 2020-08-14 MED ORDER — ONDANSETRON HCL 4 MG/2ML IJ SOLN
INTRAMUSCULAR | Status: DC | PRN
  Administered 2020-08-14: 4 mg via INTRAVENOUS

## 2020-08-14 MED ORDER — MENTHOL 3 MG MT LOZG: 1.0000 | LOZENGE | OROMUCOSAL | Status: DC | PRN

## 2020-08-14 MED ORDER — OXYTOCIN-SODIUM CHLORIDE 30-0.9 UT/500ML-% IV SOLN: 2.5000 [IU]/h | INTRAVENOUS | Status: AC

## 2020-08-14 MED ORDER — OXYCODONE-ACETAMINOPHEN 5-325 MG PO TABS: 2.0000 | ORAL_TABLET | ORAL | Status: DC | PRN

## 2020-08-14 MED ORDER — DEXAMETHASONE SODIUM PHOSPHATE 4 MG/ML IJ SOLN
INTRAMUSCULAR | Status: DC | PRN
  Administered 2020-08-14: 4 mg via INTRAVENOUS

## 2020-08-14 MED ORDER — MEPERIDINE HCL 25 MG/ML IJ SOLN: 6.2500 mg | INTRAMUSCULAR | Status: DC | PRN

## 2020-08-14 MED ORDER — SENNOSIDES-DOCUSATE SODIUM 8.6-50 MG PO TABS
2.0000 | ORAL_TABLET | ORAL | Status: DC
  Administered 2020-08-15 – 2020-08-18 (×3): 2 via ORAL
  Filled 2020-08-14 (×4): qty 2

## 2020-08-14 MED ORDER — INSULIN REGULAR(HUMAN) IN NACL 100-0.9 UT/100ML-% IV SOLN
INTRAVENOUS | Status: DC
  Administered 2020-08-14: 0.6 [IU]/h via INTRAVENOUS
  Administered 2020-08-14: 0.8 [IU]/h via INTRAVENOUS
  Administered 2020-08-14: 1.7 [IU]/h via INTRAVENOUS
  Filled 2020-08-14: qty 100

## 2020-08-14 MED ORDER — TETANUS-DIPHTH-ACELL PERTUSSIS 5-2.5-18.5 LF-MCG/0.5 IM SUSY: 0.5000 mL | PREFILLED_SYRINGE | Freq: Once | INTRAMUSCULAR | Status: DC

## 2020-08-14 MED ORDER — MORPHINE SULFATE (PF) 0.5 MG/ML IJ SOLN
INTRAMUSCULAR | Status: DC | PRN
  Administered 2020-08-14: .15 ug via INTRATHECAL

## 2020-08-14 MED ORDER — SIMETHICONE 80 MG PO CHEW
80.0000 mg | CHEWABLE_TABLET | ORAL | Status: DC
  Administered 2020-08-14 – 2020-08-18 (×3): 80 mg via ORAL
  Filled 2020-08-14 (×3): qty 1

## 2020-08-14 MED ORDER — BUTORPHANOL TARTRATE 1 MG/ML IJ SOLN: 1.0000 mg | INTRAMUSCULAR | Status: DC | PRN

## 2020-08-14 MED ORDER — MEASLES, MUMPS & RUBELLA VAC IJ SOLR: 0.5000 mL | Freq: Once | INTRAMUSCULAR | Status: DC

## 2020-08-14 MED ORDER — FENTANYL CITRATE (PF) 100 MCG/2ML IJ SOLN
INTRAMUSCULAR | Status: AC
  Filled 2020-08-14: qty 2

## 2020-08-14 MED ORDER — ONDANSETRON HCL 4 MG/2ML IJ SOLN
INTRAMUSCULAR | Status: AC
  Filled 2020-08-14: qty 2

## 2020-08-14 MED ORDER — DIPHENHYDRAMINE HCL 50 MG/ML IJ SOLN
INTRAMUSCULAR | Status: DC | PRN
  Administered 2020-08-14: 25 mg via INTRAVENOUS

## 2020-08-14 MED ORDER — WITCH HAZEL-GLYCERIN EX PADS: 1.0000 "application " | MEDICATED_PAD | CUTANEOUS | Status: DC | PRN

## 2020-08-14 MED ORDER — DEXTROSE 50 % IV SOLN
INTRAVENOUS | Status: AC
  Filled 2020-08-14: qty 50

## 2020-08-14 MED ORDER — LEVOTHYROXINE SODIUM 75 MCG PO TABS
75.0000 ug | ORAL_TABLET | Freq: Every day | ORAL | Status: DC
  Administered 2020-08-15 – 2020-08-18 (×4): 75 ug via ORAL
  Filled 2020-08-14 (×4): qty 1

## 2020-08-14 MED ORDER — LEVOTHYROXINE SODIUM 75 MCG PO TABS
75.0000 ug | ORAL_TABLET | Freq: Every day | ORAL | Status: DC
  Administered 2020-08-14: 75 ug via ORAL
  Filled 2020-08-14 (×2): qty 1

## 2020-08-14 MED ORDER — DIPHENHYDRAMINE HCL 25 MG PO CAPS: 25.0000 mg | ORAL_CAPSULE | Freq: Four times a day (QID) | ORAL | Status: DC | PRN

## 2020-08-14 MED ORDER — OXYTOCIN-SODIUM CHLORIDE 30-0.9 UT/500ML-% IV SOLN
2.5000 [IU]/h | INTRAVENOUS | Status: DC
  Filled 2020-08-14: qty 500

## 2020-08-14 MED ORDER — DIPHENHYDRAMINE HCL 50 MG/ML IJ SOLN
INTRAMUSCULAR | Status: AC
  Filled 2020-08-14: qty 1

## 2020-08-14 MED ORDER — NALOXONE HCL 0.4 MG/ML IJ SOLN: 0.4000 mg | INTRAMUSCULAR | Status: DC | PRN

## 2020-08-14 MED ORDER — PROMETHAZINE HCL 25 MG/ML IJ SOLN
INTRAMUSCULAR | Status: DC | PRN
  Administered 2020-08-14: 6.25 mg via INTRAVENOUS

## 2020-08-14 MED ORDER — IBUPROFEN 600 MG PO TABS: 600.0000 mg | ORAL_TABLET | Freq: Four times a day (QID) | ORAL | Status: DC | PRN

## 2020-08-14 MED ORDER — LACTATED RINGERS IV SOLN: INTRAVENOUS | Status: DC | PRN

## 2020-08-14 MED ORDER — INSULIN DETEMIR 100 UNIT/ML ~~LOC~~ SOLN
20.0000 [IU] | Freq: Two times a day (BID) | SUBCUTANEOUS | Status: DC
  Filled 2020-08-14 (×2): qty 0.2

## 2020-08-14 MED ORDER — OXYCODONE HCL 5 MG PO TABS: 5.0000 mg | ORAL_TABLET | Freq: Once | ORAL | Status: DC | PRN

## 2020-08-14 MED ORDER — PHENYLEPHRINE HCL-NACL 20-0.9 MG/250ML-% IV SOLN
INTRAVENOUS | Status: DC | PRN
  Administered 2020-08-14: 60 ug/min via INTRAVENOUS

## 2020-08-14 MED ORDER — ONDANSETRON HCL 4 MG/2ML IJ SOLN: 4.0000 mg | Freq: Four times a day (QID) | INTRAMUSCULAR | Status: DC | PRN

## 2020-08-14 MED ORDER — OXYCODONE HCL 5 MG/5ML PO SOLN: 5.0000 mg | Freq: Once | ORAL | Status: DC | PRN

## 2020-08-14 MED ORDER — SODIUM CHLORIDE 0.9 % IR SOLN
Status: DC | PRN
  Administered 2020-08-14: 1

## 2020-08-14 MED ORDER — LACTATED RINGERS IV SOLN: 500.0000 mL | INTRAVENOUS | Status: DC | PRN

## 2020-08-14 MED ORDER — PHENYLEPHRINE HCL (PRESSORS) 10 MG/ML IV SOLN
INTRAVENOUS | Status: DC | PRN
  Administered 2020-08-14: 80 ug via INTRAVENOUS
  Administered 2020-08-14: 40 ug via INTRAVENOUS

## 2020-08-14 MED ORDER — PRENATAL MULTIVITAMIN CH
1.0000 | ORAL_TABLET | Freq: Every day | ORAL | Status: DC
  Administered 2020-08-15 – 2020-08-18 (×4): 1 via ORAL
  Filled 2020-08-14 (×4): qty 1

## 2020-08-14 MED ORDER — DIBUCAINE (PERIANAL) 1 % EX OINT: 1.0000 "application " | TOPICAL_OINTMENT | CUTANEOUS | Status: DC | PRN

## 2020-08-14 MED ORDER — KETOROLAC TROMETHAMINE 30 MG/ML IJ SOLN
INTRAMUSCULAR | Status: AC
  Filled 2020-08-14: qty 1

## 2020-08-14 MED ORDER — SIMETHICONE 80 MG PO CHEW
80.0000 mg | CHEWABLE_TABLET | Freq: Three times a day (TID) | ORAL | Status: DC
  Administered 2020-08-15 – 2020-08-18 (×7): 80 mg via ORAL
  Filled 2020-08-14 (×8): qty 1

## 2020-08-14 MED ORDER — MORPHINE SULFATE (PF) 0.5 MG/ML IJ SOLN
INTRAMUSCULAR | Status: AC
  Filled 2020-08-14: qty 10

## 2020-08-14 MED ORDER — METFORMIN HCL ER 500 MG PO TB24
1000.0000 mg | ORAL_TABLET | Freq: Every day | ORAL | Status: DC
  Filled 2020-08-14: qty 2

## 2020-08-14 MED ORDER — DEXTROSE 50 % IV SOLN
INTRAVENOUS | Status: DC | PRN
  Administered 2020-08-14: 10 mL via INTRAVENOUS

## 2020-08-14 MED ORDER — OXYTOCIN-SODIUM CHLORIDE 30-0.9 UT/500ML-% IV SOLN
INTRAVENOUS | Status: AC
  Filled 2020-08-14: qty 500

## 2020-08-14 MED ORDER — FENTANYL CITRATE (PF) 100 MCG/2ML IJ SOLN
INTRAMUSCULAR | Status: DC | PRN
  Administered 2020-08-14: 15 ug via INTRATHECAL

## 2020-08-14 MED ORDER — KETOROLAC TROMETHAMINE 30 MG/ML IJ SOLN
30.0000 mg | Freq: Four times a day (QID) | INTRAMUSCULAR | Status: DC | PRN
  Administered 2020-08-15 (×2): 30 mg via INTRAVENOUS
  Filled 2020-08-14 (×2): qty 1

## 2020-08-14 MED ORDER — SOD CITRATE-CITRIC ACID 500-334 MG/5ML PO SOLN
30.0000 mL | ORAL | Status: DC | PRN
  Administered 2020-08-14: 30 mL via ORAL
  Filled 2020-08-14: qty 15

## 2020-08-14 MED ORDER — DEXTROSE 50 % IV SOLN: 0.0000 mL | INTRAVENOUS | Status: DC | PRN

## 2020-08-14 SURGICAL SUPPLY — 31 items
BENZOIN TINCTURE PRP APPL 2/3 (GAUZE/BANDAGES/DRESSINGS) ×2 IMPLANT
CHLORAPREP W/TINT 26ML (MISCELLANEOUS) ×2 IMPLANT
CLAMP CORD UMBIL (MISCELLANEOUS) IMPLANT
CLOTH BEACON ORANGE TIMEOUT ST (SAFETY) ×2 IMPLANT
CLSR STERI-STRIP ANTIMIC 1/2X4 (GAUZE/BANDAGES/DRESSINGS) ×2 IMPLANT
DRSG OPSITE POSTOP 4X10 (GAUZE/BANDAGES/DRESSINGS) ×2 IMPLANT
ELECT REM PT RETURN 9FT ADLT (ELECTROSURGICAL) ×2
ELECTRODE REM PT RTRN 9FT ADLT (ELECTROSURGICAL) ×1 IMPLANT
EXTRACTOR VACUUM M CUP 4 TUBE (SUCTIONS) IMPLANT
GLOVE BIOGEL PI IND STRL 7.0 (GLOVE) ×1 IMPLANT
GLOVE BIOGEL PI INDICATOR 7.0 (GLOVE) ×1
GLOVE SURG ORTHO 8.0 STRL STRW (GLOVE) ×4 IMPLANT
GOWN STRL REUS W/TWL LRG LVL3 (GOWN DISPOSABLE) ×4 IMPLANT
KIT ABG SYR 3ML LUER SLIP (SYRINGE) ×2 IMPLANT
NEEDLE HYPO 25X5/8 SAFETYGLIDE (NEEDLE) ×2 IMPLANT
NS IRRIG 1000ML POUR BTL (IV SOLUTION) ×2 IMPLANT
PACK C SECTION WH (CUSTOM PROCEDURE TRAY) ×2 IMPLANT
PAD OB MATERNITY 4.3X12.25 (PERSONAL CARE ITEMS) ×2 IMPLANT
PENCIL SMOKE EVAC W/HOLSTER (ELECTROSURGICAL) ×2 IMPLANT
SPONGE LAP 18X18 X RAY DECT (DISPOSABLE) ×2 IMPLANT
SUT MNCRL 0 VIOLET CTX 36 (SUTURE) ×3 IMPLANT
SUT MON AB 4-0 PS1 27 (SUTURE) ×2 IMPLANT
SUT MONOCRYL 0 CTX 36 (SUTURE) ×6
SUT PDS AB 1 CT  36 (SUTURE)
SUT PDS AB 1 CT 36 (SUTURE) IMPLANT
SUT PLAIN 2 0 XLH (SUTURE) ×2 IMPLANT
SUT VIC AB 1 CTX 36 (SUTURE)
SUT VIC AB 1 CTX36XBRD ANBCTRL (SUTURE) IMPLANT
TOWEL OR 17X24 6PK STRL BLUE (TOWEL DISPOSABLE) ×2 IMPLANT
TRAY FOLEY W/BAG SLVR 14FR LF (SET/KITS/TRAYS/PACK) ×2 IMPLANT
WATER STERILE IRR 1000ML POUR (IV SOLUTION) ×2 IMPLANT

## 2020-08-14 NOTE — Op Note (Signed)
Cesarean Section Procedure Note  Pre-operative Diagnosis: IUP at 37 weeks, Gestational HTN, type 2 diabetes, oligohydraminos, maternal request for cesarean section  Post-operative Diagnosis: same  Surgeon: Turner Daniels   Assistants: none  Anesthesia: spinal  Procedure:  Low Segment Transverse cesarean section  Procedure Details  The patient was seen in the Holding Room. The risks, benefits, complications, treatment options, and expected outcomes were discussed with the patient.  The patient concurred with the proposed plan, giving informed consent.  The site of surgery properly noted/marked.. A Time Out was held and the above information confirmed.  After induction of anesthesia, the patient was draped and prepped in the usual sterile manner. A Pfannenstiel incision was made and carried down through the subcutaneous tissue to the fascia. Fascial incision was made and extended transversely. The fascia was separated from the underlying rectus tissue superiorly and inferiorly. The peritoneum was identified and entered. Peritoneal incision was extended longitudinally. The utero-vesical peritoneal reflection was incised transversely and the bladder flap was bluntly freed from the lower uterine segment. A low transverse uterine incision was made. Delivered from vertex presentation was a baby with Apgar scores of 8 at one minute and 9 at five minutes. After the umbilical cord was clamped and cut cord blood was obtained for evaluation. The placenta was removed intact and appeared normal. The uterine outline, tubes and ovaries appeared normal. The uterine incision was closed with running locked sutures of 0 monocryl and imbricated with 0 monocryl. Hemostasis was observed. Lavage was carried out until clear. The peritoneum was then closed with 0 monocryl and rectus muscles plicated in the midline.  After hemostasis was assured, the fascia was then reapproximated with running sutures of 0 PDS. Irrigation was  applied and after adequate hemostasis was assured, the skin was reapproximated with subcutaneous sutures using 4-0 monocryl.  Instrument, sponge, and needle counts were correct prior the abdominal closure and at the conclusion of the case. The patient received 2 grams cefotetan preoperatively.  Findings: Viable female  Estimated Blood Loss:  289         Specimens: Placenta was sent to labor and delivery         Complications:  None

## 2020-08-14 NOTE — Progress Notes (Signed)
Dr Rana Snare at beside to discuss plan of care. Per Dr. Rana Snare, RN discontinued pitocin. Patient and family to discuss plan of care and update RN when decision made.

## 2020-08-14 NOTE — Anesthesia Postprocedure Evaluation (Signed)
Anesthesia Post Note  Patient: Melissa Mullins  Procedure(s) Performed: CESAREAN SECTION (N/A )     Patient location during evaluation: PACU Anesthesia Type: Spinal Level of consciousness: awake and alert and oriented Pain management: pain level controlled Vital Signs Assessment: post-procedure vital signs reviewed and stable Respiratory status: spontaneous breathing, nonlabored ventilation and respiratory function stable Cardiovascular status: blood pressure returned to baseline and stable Postop Assessment: no headache, no backache, spinal receding, patient able to bend at knees and no apparent nausea or vomiting Anesthetic complications: no   No complications documented.  Last Vitals:  Vitals:   08/14/20 2200 08/14/20 2215  BP: 125/71 119/65  Pulse: 72 76  Resp: 13 15  Temp:    SpO2: 99% 100%    Last Pain:  Vitals:   08/14/20 2215  TempSrc:   PainSc: 0-No pain   Pain Goal: Patients Stated Pain Goal: 0 (08/14/20 0720)  LLE Motor Response: Purposeful movement (08/14/20 2215) LLE Sensation: Tingling (08/14/20 2215) RLE Motor Response: Purposeful movement (08/14/20 2215) RLE Sensation: Tingling (08/14/20 2215)     Epidural/Spinal Function Cutaneous sensation: Able to Discern Pressure (08/14/20 2215), Patient able to flex knees: Yes (08/14/20 2215), Patient able to lift hips off bed: Yes (08/14/20 2215), Back pain beyond tenderness at insertion site: No (08/14/20 2215), Progressively worsening motor and/or sensory loss: No (08/14/20 2215), Bowel and/or bladder incontinence post epidural: No (08/14/20 2215)  Tennis Must Viola

## 2020-08-14 NOTE — Progress Notes (Addendum)
Patient ID: Melissa Mullins, female   DOB: 10-Feb-1996, 24 y.o.   MRN: [id number] After cytotec and pitocin, she has progressed to 1/80/-3 We discussed options:  Change to cytotec tonight, then pitocin, continue with pitocin, and we discussed primary c/s (which the patient was requesting) We discussed that at this time, there is not a fetal or maternal indication for cesarean section, but she could request it for maternal request or failed induction. We discussed the R&B and future pregnancy risks if she has multiple cesarean sections.  After careful thought, she elects to proceed with cesarean section FHR is cat 1, 140s with accesl Ctxs were 2-3 on 12miu/pit BPs c/w gest HTN mild Glc levels controlled with insulin  Gest HTN T2 DM Borderline oligohydraminos Maternal request for c/s Risks and benefits of C/S were discussed.  All questions were answered and informed consent was obtained.  Plan to proceed with low segment transverse Cesarean Section. She states she had PCN as a child and had hives, but had strep throat as an adult and she thinks she took amoxil and did fine.  She's ok with using cefotetan as abx for c/s.

## 2020-08-14 NOTE — H&P (Signed)
Melissa Mullins is a 24 y.o. female presenting for IOL due to gestational HTN, type 2 diabetes controlled with metformin and insulin, and borderline oligohydraminos.  GBS -. OB History    Gravida  1   Para      Term      Preterm      AB      Living        SAB      TAB      Ectopic      Multiple      Live Births             Past Medical History:  Diagnosis Date  . Diabetes mellitus without complication (HCC)    Type 2 Diagnosed in 2019  . Hypertension    Pt reports intermittent elevation-not severe range following BP;s at home x 2 weeks   Past Surgical History:  Procedure Laterality Date  . NO PAST SURGERIES     Family History: family history is not on file. Social History:  reports that she has never smoked. She has never used smokeless tobacco. She reports that she does not drink alcohol and does not use drugs.     Maternal Diabetes: Yes:  Diabetes Type:  Insulin/Medication controlled Genetic Screening: Normal Maternal Ultrasounds/Referrals: Normal Fetal Ultrasounds or other Referrals:  None Maternal Substance Abuse:  no Significant Maternal Medications:  None Significant Maternal Lab Results:  Group B Strep negative Other Comments:  recent US with AFI 8%tile, and recent insulin demands decreasing  Review of Systems History Dilation: Fingertip Effacement (%): 50 Station: -2 Exam by:: Dr Rana Snare Blood pressure 130/77, pulse 75, temperature 98.1 F (36.7 C), temperature source Oral, resp. rate 16, height 5\' 2"  (1.575 m), weight 105.7 kg. Exam Physical Exam  Prenatal labs: ABO, Rh: --/--/AB POS (12/03 0040) Antibody: NEG (12/03 0040) Rubella: Immune (05/19 0000) RPR: Nonreactive (05/19 0000)  HBsAg: Negative (05/19 0000)  HIV: Non-reactive (05/19 0000)  GBS: Negative/-- (11/23 0000)   Assessment/Plan: IUP at term Gestation HTN without severe features.  Will monitor BPs and treat prn Check labs T2 DM- will manage with the help of the diabetes  coordinator.  Discussed this with the patient as well.  To follow up with Dr [phone] (endocrine) post delivery and asked to let her know today she's in the hospital and let me know if she has any specific requests. Minimal change with cytotec.  Will start pitocin today and arom when able.   Talmage Nap 08/14/2020, 10:31 AM

## 2020-08-14 NOTE — Anesthesia Preprocedure Evaluation (Addendum)
Anesthesia Evaluation  Patient identified by MRN, date of birth, ID band Patient awake    Reviewed: Allergy & Precautions, Patient's Chart, lab work & pertinent test results  Airway Mallampati: II  TM Distance: >3 FB Neck ROM: Full    Dental no notable dental hx.    Pulmonary neg pulmonary ROS,    Pulmonary exam normal breath sounds clear to auscultation       Cardiovascular hypertension, negative cardio ROS Normal cardiovascular exam Rhythm:Regular Rate:Normal     Neuro/Psych negative neurological ROS  negative psych ROS   GI/Hepatic negative GI ROS, Neg liver ROS,   Endo/Other  diabetes, Well Controlled, Type 2, Insulin Dependent, Oral Hypoglycemic AgentsHypothyroidism Morbid obesityBMI 43  Renal/GU negative Renal ROS  negative genitourinary   Musculoskeletal negative musculoskeletal ROS (+)   Abdominal (+) + obese,   Peds negative pediatric ROS (+)  Hematology negative hematology ROS (+) hct 38.3, plt 254   Anesthesia Other Findings   Reproductive/Obstetrics (+) Pregnancy G1P0 IVF failed induction                             Anesthesia Physical  Anesthesia Plan  ASA: III  Anesthesia Plan: Spinal   Post-op Pain Management:    Induction:   PONV Risk Score and Plan: 2 and Treatment may vary due to age or medical condition, Ondansetron and Dexamethasone  Airway Management Planned: Natural Airway and Nasal Cannula  Additional Equipment: None  Intra-op Plan:   Post-operative Plan:   Informed Consent: I have reviewed the patients History and Physical, chart, labs and discussed the procedure including the risks, benefits and alternatives for the proposed anesthesia with the patient or authorized representative who has indicated his/her understanding and acceptance.       Plan Discussed with: CRNA  Anesthesia Plan Comments:         Anesthesia Quick Evaluation  

## 2020-08-14 NOTE — Transfer of Care (Signed)
Immediate Anesthesia Transfer of Care Note  Patient: Melissa Mullins  Procedure(s) Performed: CESAREAN SECTION (N/A )  Patient Location: PACU  Anesthesia Type:Spinal  Level of Consciousness: awake  Airway & Oxygen Therapy: Patient Spontanous Breathing  Post-op Assessment: Report given to RN and Post -op Vital signs reviewed and stable  Post vital signs: Reviewed and stable  Last Vitals:  Vitals Value Taken Time  BP    Temp    Pulse    Resp    SpO2      Last Pain:  Vitals:   08/14/20 1430  TempSrc: Oral  PainSc:       Patients Stated Pain Goal: 0 (08/14/20 0720)  Complications: No complications documented.

## 2020-08-14 NOTE — Anesthesia Procedure Notes (Signed)
Spinal  Patient location during procedure: OR Start time: 08/14/2020 7:42 PM End time: 08/14/2020 7:45 PM Staffing Performed: anesthesiologist  Anesthesiologist: Lannie Fields, DO Preanesthetic Checklist Completed: patient identified, IV checked, risks and benefits discussed, surgical consent, monitors and equipment checked, pre-op evaluation and timeout performed Spinal Block Patient position: sitting Prep: DuraPrep and site prepped and draped Patient monitoring: cardiac monitor, continuous pulse ox and blood pressure Approach: midline Location: L3-4 Injection technique: single-shot Needle Needle type: Pencan  Needle gauge: 24 G Needle length: 9 cm Assessment Sensory level: T6 Additional Notes Functioning IV was confirmed and monitors were applied. Sterile prep and drape, including hand hygiene and sterile gloves were used. The patient was positioned and the spine was prepped. The skin was anesthetized with lidocaine.  Free flow of clear CSF was obtained prior to injecting local anesthetic into the CSF.  The spinal needle aspirated freely following injection.  The needle was carefully withdrawn.  The patient tolerated the procedure well.

## 2020-08-14 NOTE — Progress Notes (Signed)
Inpatient Diabetes Program Recommendations  Diabetes Treatment Program Recommendations  ADA Standards of Care 2018 Diabetes in Pregnancy Target Glucose Ranges:  Fasting: 60 - 90 mg/dL Preprandial: 60 - 209 mg/dL 1 hr postprandial: Less than 140mg /dL (from first bite of meal) 2 hr postprandial: Less than 120 mg/dL (from first bite of meal)   Review of Glycemic Control Results for ARIEAL, CUOCO (MRN Baldemar Lenis) as of 08/14/2020 09:51  Ref. Range 08/09/2020 21:37  Glucose Latest Ref Range: 70 - 99 mg/dL 08/11/2020 (H)   Diabetes history: Type 2 DM Outpatient Diabetes medications: Metformin 1000 mg BID, Levemir 40 units QHS, if CBG >100 mg/dL, 64 units QAM, Novolog 30 units TID Pre-Pregnant- Levemir 70 units QAM, 40 units QPM, Novolog SSI TID Current orders for Inpatient glycemic control: none  Inpatient Diabetes Program Recommendations:    Noted consult, Spoke with 629, RN and confirms patient is wearing Dexcom. Plan for IOL for GHTN.   At this time, consider: -CBGs Q4H -If values exceed 120 mg/dL consistently x 2 occurrences, recommend starting Endo tool for labor.  -Use of Dexcom is not recommended during inpatient use while on IV insulin  At delivery recommend: -Levemir 20 units two hours prior to discontinuation of IV insulin, then BID to follow -Novolog 0-15 units TID & Hs -Novolog 8 units TID (assuming patient is consuming >50% of meal)  Spoke with patient regarding outpatient management of diabetes in pregnancy. Patient is followed by Dr [place], outpatient endocrinology with last A1C 4.6% (per patient). Verified outpatient dosages and pre-pregnant dosages. Patient is currently using Dexcom for CBG monitoring.  Discussed plan of care with reference to CBG target goals and plan for when blood glucose trends exceed pregnant goals of 120 mg/dL. Reviewed current glucose trends and need for basal following delivery. Educated on risk for hypoglycemia in the initial post partum period,  impact of lactation, target goals following delivery and when to call MD post partum.  Patient in agreement with post delivery recommendations and plans to follow up with Dr Talmage Nap. Has no further questions at this time. Dexcom removed by nursing staff due to location on abdomen interfering with FHR monitoring.  Freestyle Libre applied to patient's left upper arm, per MD order. Reviewed risks and benefits of device. Additionally, informed MD and patient that device not approved for inpatient use, especially while on IV insulin. Thus, in the event IV insulin is initiated would prefer finger sticks. MD order placed.  Following.  Thanks, Talmage Nap, MSN, RNC-OB Diabetes Coordinator [phone] (8a-5p)

## 2020-08-15 ENCOUNTER — Encounter (HOSPITAL_COMMUNITY): Payer: Self-pay | Admitting: Obstetrics and Gynecology

## 2020-08-15 LAB — BASIC METABOLIC PANEL
Anion gap: 11 (ref 5–15)
BUN: 5 mg/dL — ABNORMAL LOW (ref 6–20)
CO2: 22 mmol/L (ref 22–32)
Calcium: 8.5 mg/dL — ABNORMAL LOW (ref 8.9–10.3)
Chloride: 102 mmol/L (ref 98–111)
Creatinine, Ser: 0.76 mg/dL (ref 0.44–1.00)
GFR, Estimated: >60 mL/min (ref >60)
Glucose, Bld: 128 mg/dL — ABNORMAL HIGH (ref 70–99)
Potassium: 4.2 mmol/L (ref 3.5–5.1)
Sodium: 135 mmol/L (ref 135–145)

## 2020-08-15 LAB — CBC
HCT: 36 % (ref 36.0–46.0)
Hemoglobin: 12 g/dL (ref 12.0–15.0)
MCH: 30.1 pg (ref 26.0–34.0)
MCHC: 33.3 g/dL (ref 30.0–36.0)
MCV: 90.2 fL (ref 80.0–100.0)
Platelets: 229 10*3/uL (ref 150–400)
RBC: 3.99 MIL/uL (ref 3.87–5.11)
RDW: 13.5 % (ref 11.5–15.5)
WBC: 13.9 10*3/uL — ABNORMAL HIGH (ref 4.0–10.5)
nRBC: 0 % (ref 0.0–0.2)

## 2020-08-15 MED ORDER — INSULIN PUMP
SUBCUTANEOUS | Status: DC
  Filled 2020-08-15: qty 1

## 2020-08-15 MED ORDER — IBUPROFEN 600 MG PO TABS: 600.0000 mg | ORAL_TABLET | Freq: Four times a day (QID) | ORAL | Status: DC | PRN

## 2020-08-15 MED ORDER — METFORMIN HCL ER 500 MG PO TB24
1000.0000 mg | ORAL_TABLET | Freq: Two times a day (BID) | ORAL | Status: DC
  Administered 2020-08-15 – 2020-08-18 (×7): 1000 mg via ORAL
  Filled 2020-08-15 (×9): qty 2

## 2020-08-15 MED ORDER — IBUPROFEN 600 MG PO TABS
600.0000 mg | ORAL_TABLET | Freq: Four times a day (QID) | ORAL | Status: DC
  Administered 2020-08-15 – 2020-08-18 (×12): 600 mg via ORAL
  Filled 2020-08-15 (×12): qty 1

## 2020-08-15 MED ORDER — OXYCODONE-ACETAMINOPHEN 5-325 MG PO TABS
1.0000 | ORAL_TABLET | ORAL | Status: DC | PRN
  Administered 2020-08-16 – 2020-08-17 (×6): 1 via ORAL
  Filled 2020-08-15 (×6): qty 1

## 2020-08-15 NOTE — Lactation Note (Signed)
This note was copied from a baby's chart. Lactation Consultation Note  Patient Name: Melissa Mullins FIEPP'I Date: 08/15/2020 Reason for consult: Follow-up assessment;Early term 37-38.6wks;Primapara;1st time breastfeeding   P1 mother whose infant is now 57 hours old.  This is an ETI  At 37+0 weeks weighing < 5 lbs.   Mother's feeding preference is breast.    Mother had no immediate questions/concerns related to breast feeding.  She was given a #24 NS which appears to be too large for her.  Suggested she call her RN to assess with the next feeding since baby has recently finished feeding.  Reviewed the LPTI policy guidelines with parents.  They will continue to feed 8-12 times/24 hours or sooner if baby shows feeding cues.  If baby remains sleeping at the third hour, parents will awaken to feed.  Reviewed cues and mother stated she has been showing cues.  Mother's supplementation choice is Similac 22 calorie formula.  They are using the gold slow flow nipple and feels like baby is doing well with this nipple.  Mother has been set up with the DEBP but states she feels slight pain with pumping.  Asked mother to call me back at the next feeding session so I can assess her pumping.  Mother verbalized understanding.    Mother has a DEBP for home use.     Maternal Data    Feeding    LATCH Score                   Interventions    Lactation Tools Discussed/Used     Consult Status Consult Status: Follow-up Date: 08/16/20 Follow-up type: In-patient    Nohealani Medinger R Tashanda Fuhrer 08/15/2020, 11:16 AM

## 2020-08-15 NOTE — Progress Notes (Signed)
Inpatient Diabetes Program Recommendations  AACE/ADA: New Consensus Statement on Inpatient Glycemic Control (2015)  Target Ranges:  Prepandial:   less than 140 mg/dL      Peak postprandial:   less than 180 mg/dL (1-2 hours)      Critically ill patients:  140 - 180 mg/dL   Lab Results  Component Value Date   GLUCAP 96 08/14/2020   HGBA1C 4.8 08/14/2020    Review of Glycemic Control Results for Melissa Mullins, Melissa Mullins (MRN [id number]) as of 08/15/2020 07:52  Ref. Range 08/14/2020 13:19 08/14/2020 21:40  Glucose-Capillary Latest Ref Range: 70 - 99 mg/dL 87 96   Diabetes history: Type 2 DM Outpatient Diabetes medications: Metformin 1000 mg BID, Levemir 40 units QHS, if CBG >100 mg/dL, 64 units QAM, Novolog 30 units TID Pre-Pregnant- Levemir 70 units QAM, 40 units QPM, Novolog SSI TID Current orders for Inpatient glycemic control: Novolog 0-15 units Q4H Decadron 4 mg x 1  Inpatient Diabetes Program Recommendations:    Given patient's current trends and previous IV insulin drip rates, would continue with correction SSI only, especially in the setting steroids.  Will follow for adjustments.   Thanks, Lujean Rave, MSN, RNC-OB Diabetes Coordinator [phone] (8a-5p)

## 2020-08-15 NOTE — Lactation Note (Signed)
This note was copied from a baby's chart. Lactation Consultation Note  Patient Name: Melissa Mullins DSWVT'V Date: 08/15/2020  P1, 6 hour ETI female infant. LC entered the room mom and infant asleep at this time. Mom current feeding choice is breast and formula. LC discussed with RN mom's plan: Mom is breastfeeding and supplementing infant with formula she declined donor breast milk due to using ( Similac Neosure 22 kcal with iron) formula earlier in PACU.. RN Erie Noe) set up with DEBP and mom plans to use DEBP in morning.  Per RN, infant latched and BF for 25 minutes at 2345 am.    Maternal Data    Feeding Feeding Type: Breast Fed  LATCH Score Latch: Repeated attempts needed to sustain latch, nipple held in mouth throughout feeding, stimulation needed to elicit sucking reflex.  Audible Swallowing: None  Type of Nipple: Flat  Comfort (Breast/Nipple): Soft / non-tender  Hold (Positioning): Assistance needed to correctly position infant at breast and maintain latch.  LATCH Score: 5  Interventions Interventions: Breast feeding basics reviewed;Assisted with latch;Breast massage;Hand express;Support pillows;Position options;Expressed milk  Lactation Tools Discussed/Used     Consult Status      Danelle Earthly 08/15/2020, 3:07 AM

## 2020-08-15 NOTE — Progress Notes (Signed)
Subjective: Postpartum Day 1: Cesarean Delivery Patient reports tolerating PO.    Objective: Vital signs in last 24 hours: Temp:  [97.9 F (36.6 C)-98.7 F (37.1 C)] 98.5 F (36.9 C) (12/04 1000) Pulse Rate:  [60-90] 60 (12/04 1000) Resp:  [13-22] 17 (12/04 1000) BP: (111-150)/(60-118) 116/60 (12/04 1000) SpO2:  [97 %-100 %] 99 % (12/04 1000)  Physical Exam:  General: alert, cooperative, appears stated age and no distress Lochia: appropriate Uterine Fundus: firm Incision: healing well DVT Evaluation: No evidence of DVT seen on physical exam.  Recent Labs    08/14/20 0938 08/15/20 0404  HGB 12.8 12.0  HCT 38.3 36.0    Assessment/Plan: Status post Cesarean section. Postoperative course complicated by T2 DM - appreciated Diabetic Coordinators assistance.  WIll follow her recomendations.  Continue current care.  Melissa Mullins 08/15/2020, 10:13 AM

## 2020-08-16 LAB — BASIC METABOLIC PANEL
Anion gap: 9 (ref 5–15)
BUN: 7 mg/dL (ref 6–20)
CO2: 24 mmol/L (ref 22–32)
Calcium: 8.2 mg/dL — ABNORMAL LOW (ref 8.9–10.3)
Chloride: 105 mmol/L (ref 98–111)
Creatinine, Ser: 0.79 mg/dL (ref 0.44–1.00)
GFR, Estimated: >60 mL/min (ref >60)
Glucose, Bld: 94 mg/dL (ref 70–99)
Potassium: 3.9 mmol/L (ref 3.5–5.1)
Sodium: 138 mmol/L (ref 135–145)

## 2020-08-16 LAB — GLUCOSE, CAPILLARY: Glucose-Capillary: 77 mg/dL (ref 70–99)

## 2020-08-16 NOTE — Lactation Note (Signed)
This note was copied from a baby's chart. Lactation Consultation Note  Patient Name: Girl Tita Terhaar WSFKC'L Date: 08/16/2020 Reason for consult: Follow-up assessment   P1 mother whose infant is now 11 hours old.  This is an ETI at 37+0 weeks weighing < 5 lbs.  Mother's feeding preference is breast.  She is supplementing with Similac 22 calorie formula using the gold slow flow nipple.  Mother is allowing baby to get used to the breast, however, has been advised to focus more on formula feeding to keep weight loss to a minimum and to get the needed calories into baby.  This plan was reiterated by me during my visit.  Reviewed the LPTI policy guideline sheet and explained how the volumes need to increase by 48 hours to 20-30 mls/feeding.  Parents verbalized understanding.  Parents are not having any difficulty feeding baby with the gold slow flow nipple.  Baby feeds actively and burps well.  She is content after feedings.  Reviewed feeding plan for tonight to include feeding at least every three hours or sooner if baby desires.  Parents will increase feeding volumes to help minimize weight loss.  Mother will continue to pump every three hours and feed back any EBM she obtains to baby.    Praised parents for working so well with their newborn.  They seem to be very diligent and receptive to all teaching.  RN in room during my visit and updated.      Maternal Data    Feeding Feeding Type: Bottle Fed - Formula Nipple Type: Nfant Slow Flow (purple)  LATCH Score                   Interventions    Lactation Tools Discussed/Used     Consult Status Consult Status: Follow-up Date: 08/17/20 Follow-up type: In-patient    Dora Sims 08/16/2020, 5:34 PM

## 2020-08-16 NOTE — Progress Notes (Signed)
Patient's freestyle libra was beeping low glucose.Glucose checked using the Libra and orange juice was given. 0908 blood sugar was rechecked with the hospital's glucose meter 15 minutes later and was 77. Diabetic educator was notified. Patient was asymptomatic.

## 2020-08-16 NOTE — Progress Notes (Signed)
Subjective: Postpartum Day 2: Cesarean Delivery Patient reports tolerating PO, + flatus and no problems voiding.    Objective: Vital signs in last 24 hours: Temp:  [97.9 F (36.6 C)-98.5 F (36.9 C)] 97.9 F (36.6 C) (12/05 0336) Pulse Rate:  [65-70] 70 (12/05 0336) Resp:  [14-18] 18 (12/05 0336) BP: (117-138)/(59-78) 138/78 (12/05 0336) SpO2:  [98 %-100 %] 100 % (12/05 0336)  Physical Exam:  General: alert, cooperative, appears stated age and no distress Lochia: appropriate Uterine Fundus: firm Incision: healing well DVT Evaluation: No evidence of DVT seen on physical exam.  Recent Labs    08/14/20 0938 08/15/20 0404  HGB 12.8 12.0  HCT 38.3 36.0    Assessment/Plan: Status post Cesarean section. Postoperative course complicated by Gestational HTN with good BPs and normal labs  Continue current care T2 DM - excellent control on metformin alone at this time with SSI Her baby is 4+9# and doing well.Melissa Mullins 08/16/2020, 10:06 AM

## 2020-08-17 ENCOUNTER — Encounter (HOSPITAL_COMMUNITY): Payer: Self-pay | Admitting: Obstetrics and Gynecology

## 2020-08-17 LAB — BASIC METABOLIC PANEL
Anion gap: 9 (ref 5–15)
BUN: 5 mg/dL — ABNORMAL LOW (ref 6–20)
CO2: 25 mmol/L (ref 22–32)
Calcium: 8.6 mg/dL — ABNORMAL LOW (ref 8.9–10.3)
Chloride: 104 mmol/L (ref 98–111)
Creatinine, Ser: 0.77 mg/dL (ref 0.44–1.00)
GFR, Estimated: >60 mL/min (ref >60)
Glucose, Bld: 96 mg/dL (ref 70–99)
Potassium: 3.6 mmol/L (ref 3.5–5.1)
Sodium: 138 mmol/L (ref 135–145)

## 2020-08-17 NOTE — Progress Notes (Signed)
Inpatient Diabetes Program Recommendations  AACE/ADA: New Consensus Statement on Inpatient Glycemic Control (2015)  Target Ranges:  Prepandial:   less than 140 mg/dL      Peak postprandial:   less than 180 mg/dL (1-2 hours)      Critically ill patients:  140 - 180 mg/dL   Lab Results  Component Value Date   GLUCAP 77 08/16/2020   HGBA1C 4.8 08/14/2020    Met with patient briefly to discuss current inpatient glucose trends. As of right now, glucose within goals for postpartum patient. Encouraged to continue with Metformin as ordered and with TID & HS monitoring unless symptomatic for hypoglycemia. Encouraged to have snack close by during infant feeding and to anticipate the possibility of glucose to increase. Patient scheduled to follow up with Dr Chalmers Cater and discussed when to call MD if needed sooner. Patient has no further questions at this time.   Thanks, Bronson Curb, MSN, RNC-OB Diabetes Coordinator [phone] (8a-5p)

## 2020-08-17 NOTE — Progress Notes (Signed)
Subjective: Postpartum Day 3: Cesarean Delivery Patient reports tolerating PO, + flatus and no problems voiding.    Objective: Vital signs in last 24 hours: Temp:  [98.1 F (36.7 C)-98.3 F (36.8 C)] 98.2 F (36.8 C) (12/06 0500) Pulse Rate:  [77-83] 81 (12/06 0500) Resp:  [16-18] 18 (12/06 0500) BP: (124-128)/(69-75) 124/75 (12/06 0500) SpO2:  [99 %] 99 % (12/05 1429)  Physical Exam:  General: alert, cooperative and no distress Lochia: appropriate Uterine Fundus: firm Incision: healing well DVT Evaluation: No evidence of DVT seen on physical exam.  Recent Labs    08/14/20 0938 08/15/20 0404  HGB 12.8 12.0  HCT 38.3 36.0    Assessment/Plan: Status post Cesarean section. Doing well postoperatively.  Continue current care. Will observe this morning Diabetes coordinator to see her today Reevaluate this pm Roselle Locus II 08/17/2020, 7:53 AM

## 2020-08-17 NOTE — Lactation Note (Signed)
This note was copied from a baby's chart. Lactation Consultation Note  Patient Name: Melissa Mullins EQAST'M Date: 08/17/2020 Reason for consult: Follow-up assessment   P1 mother whose infant is now 23 hours old.  This is an ETI at 37+0 weeks weighing < 5 lbs.  Mother's plan is to eventually breast feed, however, due to baby's gestational age and size, parents are bottle feeding only.     Reviewed last night's feeding with parents.  They have been diligent with feedings and mother is continuing to pump with the DEBP.  She is now obtaining a few mls of EBM with pumping and is feeding the EBM back to baby.  Parents stated that baby continues to feed well with the gold slow flow nipple.  Volumes have increased since yesterday; no emesis noted.  Baby last fed 32 mls at 0600.  She appeared sleepy when I arrived and grandmother was attempting to nipple feed baby.  Asked permission to assist and grandmother appreciative of help.  Showed family how to pace bottle feed.  Demonstrated how to awaken baby when she appears more sleepy.  With ease, baby consumed 26 mls, burped well and fell asleep in my arms.  SLP in at the end of the feeding.  She will plan to return around 1200 for a consult.   Parents have been diligent about feeding baby and have worked well together.  I feel they are competent and comfortable with caring for their daughter and maintaining the feeding plan.  Grandmother very willing to help and all adults are receptive to learning.  Parents are hoping to be discharged today.   Maternal Data    Feeding Feeding Type: Bottle Fed - Formula Nipple Type: Slow - flow  LATCH Score                   Interventions    Lactation Tools Discussed/Used     Consult Status Consult Status: Follow-up Date: 08/18/20 Follow-up type: In-patient    Melissa Mullins R Melissa Mullins 08/17/2020, 9:42 AM

## 2020-08-18 LAB — BASIC METABOLIC PANEL
Anion gap: 9 (ref 5–15)
BUN: 6 mg/dL (ref 6–20)
CO2: 24 mmol/L (ref 22–32)
Calcium: 8.6 mg/dL — ABNORMAL LOW (ref 8.9–10.3)
Chloride: 106 mmol/L (ref 98–111)
Creatinine, Ser: 0.72 mg/dL (ref 0.44–1.00)
GFR, Estimated: >60 mL/min (ref >60)
Glucose, Bld: 93 mg/dL (ref 70–99)
Potassium: 3.7 mmol/L (ref 3.5–5.1)
Sodium: 139 mmol/L (ref 135–145)

## 2020-08-18 MED ORDER — IBUPROFEN 600 MG PO TABS: 600.0000 mg | ORAL_TABLET | Freq: Four times a day (QID) | ORAL | 0 refills | Status: DC | PRN

## 2020-08-18 MED ORDER — DOCUSATE SODIUM 100 MG PO CAPS: 100.0000 mg | ORAL_CAPSULE | Freq: Two times a day (BID) | ORAL | 2 refills | Status: AC

## 2020-08-18 MED ORDER — OXYCODONE HCL 5 MG PO TABS
5.0000 mg | ORAL_TABLET | ORAL | 0 refills | Status: DC | PRN
Start: 2020-08-18 — End: 2021-08-04

## 2020-08-18 NOTE — Discharge Summary (Signed)
   Postpartum Discharge Summary  Date of Service updated 08/18/20     Patient Name: Melissa Mullins DOB: 03/22/1996 MRN: [id number]  Date of admission: 08/14/2020 Delivery date:08/14/2020  Delivering provider: LOWE, DAVID  Date of discharge: 08/18/2020  Admitting diagnosis: Encounter for induction of labor [Z34.90] Intrauterine pregnancy: [redacted]w[redacted]d     Secondary diagnosis:  Active Problems:   Encounter for induction of labor  Additional problems: T2DM, hypothyroidism, oligohydramnios    Discharge diagnosis: Term Pregnancy Delivered and Gestational Hypertension                                              Post partum procedures:none Augmentation: AROM, Pitocin and Cytotec Complications: None  Hospital course: Induction of Labor With Cesarean Section   24 y.o. yo G1P1001 at [redacted]w[redacted]d was admitted to the hospital 08/14/2020 for induction of labor. Patient had a labor course significant for no progressiong past 1cm dilation. The patient went for cesarean section due to patient request. Delivery details are as follows: Membrane Rupture Time/Date: 8:07 PM ,08/14/2020   Delivery Method:C-Section, Low Transverse  Details of operation can be found in separate operative Note.  Patient had an uncomplicated postpartum course. She is ambulating, tolerating a regular diet, passing flatus, and urinating well.  Patient is discharged home in stable condition on 08/18/20.      Newborn Data: Birth date:08/14/2020  Birth time:8:08 PM  Gender:Female  Living status:Living  Apgars:8 ,9  Weight:2230 g                                 Magnesium Sulfate received: No BMZ received: No Rhophylac:N/A MMR:N/A T-DaP:Given prenatally Flu: N/A Transfusion:No  Physical exam  Vitals:   08/17/20 0500 08/17/20 1454 08/17/20 2025 08/18/20 0545  BP: 124/75 136/85 (!) 146/87 136/85  Pulse: 81 91 82 91  Resp: 18 16 17 17  Temp: 98.2 F (36.8 C) 98 F (36.7 C) 98.8 F (37.1 C) (!) 97.5 F (36.4 C)  TempSrc: Oral  Oral  Oral  SpO2:   100% 99%  Weight:      Height:       General: alert Lochia: appropriate Uterine Fundus: firm Incision: Healing well with no significant drainage DVT Evaluation: No evidence of DVT seen on physical exam. Labs: Lab Results  Component Value Date   WBC 13.9 (H) 08/15/2020   HGB 12.0 08/15/2020   HCT 36.0 08/15/2020   MCV 90.2 08/15/2020   PLT 229 08/15/2020   CMP Latest Ref Rng & Units 08/18/2020  Glucose 70 - 99 mg/dL 93  BUN 6 - 20 mg/dL 6  Creatinine 0.44 - 1.00 mg/dL 0.72  Sodium 135 - 145 mmol/L 139  Potassium 3.5 - 5.1 mmol/L 3.7  Chloride 98 - 111 mmol/L 106  CO2 22 - 32 mmol/L 24  Calcium 8.9 - 10.3 mg/dL 8.6(L)  Total Protein 6.5 - 8.1 g/dL -  Total Bilirubin 0.3 - 1.2 mg/dL -  Alkaline Phos 38 - 126 U/L -  AST 15 - 41 U/L -  ALT 0 - 44 U/L -   Edinburgh Score: Edinburgh Postnatal Depression Scale Screening Tool 08/16/2020  I have been able to laugh and see the funny side of things. 0  I have looked forward with enjoyment to things. 0  I have blamed myself unnecessarily when   things went wrong. 1  I have been anxious or worried for no good reason. 2  I have felt scared or panicky for no good reason. 1  Things have been getting on top of me. 1  I have been so unhappy that I have had difficulty sleeping. 0  I have felt sad or miserable. 1  I have been so unhappy that I have been crying. 1  The thought of harming myself has occurred to me. 0  Edinburgh Postnatal Depression Scale Total 7      After visit meds:  Allergies as of 08/18/2020      Reactions   Penicillins Hives   Pt reports as a child has avoided drug since   Sulfa Antibiotics Hives   Pt reports reaction as a child-has avoided medication since      Medication List    STOP taking these medications   insulin aspart 100 UNIT/ML injection Commonly known as: novoLOG   insulin detemir 100 UNIT/ML injection Commonly known as: LEVEMIR   LEVEMIR FLEXPEN    multivitamins with iron  Tabs tablet     TAKE these medications   docusate sodium 100 MG capsule Commonly known as: Colace Take 1 capsule (100 mg total) by mouth 2 (two) times daily.   ibuprofen 600 MG tablet Commonly known as: ADVIL Take 1 tablet (600 mg total) by mouth every 6 (six) hours as needed.   levothyroxine 25 MCG tablet Commonly known as: SYNTHROID Take 75 mcg by mouth daily before breakfast.   metFORMIN 500 MG tablet Commonly known as: GLUCOPHAGE Take 1,000 mg by mouth 2 (two) times daily with a meal.   multivitamin-prenatal 27-0.8 MG Tabs tablet Take 1 tablet by mouth daily at 12 noon.   oxyCODONE 5 MG immediate release tablet Commonly known as: Oxy IR/ROXICODONE Take 1 tablet (5 mg total) by mouth every 4 (four) hours as needed for severe pain.        Discharge home in stable condition Infant Feeding: Bottle and Breast Infant Disposition:home with mother Discharge instruction: per After Visit Summary and Postpartum booklet. Activity: Advance as tolerated. Pelvic rest for 6 weeks.  Diet: routine diet Anticipated Birth Control: Unsure Postpartum Appointment:6 weeks Additional Postpartum F/U: n/a Future Appointments:No future appointments. Follow up Visit:      08/18/2020 Elise Jennifer Leger, MD  

## 2020-08-18 NOTE — Progress Notes (Signed)
Patient's own glucose meter

## 2020-08-18 NOTE — Lactation Note (Signed)
This note was copied from a baby's chart. Lactation Consultation Note  Patient Name: Melissa Mullins QIONG'E Date: 08/18/2020 Reason for consult: Follow-up assessment   Infant is 84 hours and is at 9.64% this am.  Mother reports that she is continuing to offer infant breastmilk and formula. She has 15 ml of ebm at the bedside waiting to give infant when she is awake. She plans to supplement infant with formula to finish out the feeding. Suggested that she offer at least 15 ml of formula. Encouraged mother to burp infant well and then off additional volume if infant will take more.  Mother has supplemental guidelines.   Mother reports that her breast are filling fuller today and that she is pumping more milk .  Discussed treatment and prevention of engorgement.  Mother reports that she has a Spectra pump at home. She was advised to pump every 2-3 hours for 15-20 mins with an additional 2-3 mins after the last few drops drop.  Reviewed milk storage guidelines.  Discussed follow up with LC to assist with continued breastfeeding support.   Mother to continue to cue base feed infant and feed at least 8-12 times or more in 24 hours and advised to allow for cluster feeding infant as needed.   Mother to continue to due STS. Mother is aware of available LC services at [facility], BFSG'S, OP Dept, and phone # for questions or concerns about breastfeeding.  Mother receptive to all teaching and plan of care.     Maternal Data    Feeding Feeding Type: Formula  LATCH Score                   Interventions Interventions: Breast massage;Expressed milk;Ice  Lactation Tools Discussed/Used     Consult Status Consult Status: Follow-up Date: 08/19/20 Follow-up type: In-patient    Stevan Born [facility] 08/18/2020, 8:33 AM

## 2020-08-19 ENCOUNTER — Ambulatory Visit: Payer: Self-pay

## 2020-08-19 NOTE — Lactation Note (Signed)
This note was copied from a baby's chart. Lactation Consultation Note  Patient Name: Melissa Mullins NOIBB'C Date: 08/19/2020 Reason for consult: Follow-up assessment   Mother reports that infant fed well yesterday but she has observed that Karel Jarvis is tired today. She just fed her 15 ml of ebm and that is all she would take. Mother had pumped 35 ml.  Mother reports that she fed frequently yesterday and does well with the Dr Manson Passey bottle nipple.   Mother has the Spectra pump at home. She was advised to continue to be consistent with pumping regiment when she gets home. She reports that she does feel fuller and she is pumping when she gets up to feed [place] in the night.  Mother is aware to follow up with LC with in 1-2 weeks after leaving the hospital.  Parents are working on frequent STS .   Maternal Data    Feeding Feeding Type: Bottle Fed - Formula Nipple Type: Dr. Lorne Skeens  [facility] Score                   Interventions    Lactation Tools Discussed/Used     Consult Status Consult Status: Follow-up Date: 08/19/20 Follow-up type: In-patient    Stevan Born [facility] 08/19/2020, 9:57 AM

## 2020-09-12 NOTE — L&D Delivery Note (Signed)
PROCEDURE DATE: 08/06/2021  PREOPERATIVE DIAGNOSIS: Decreased AFI, A2GDM, [redacted]w[redacted]d, history of prior C section x 1   POSTOPERATIVE DIAGNOSIS: The same   PROCEDURE:    Repeat Low Transverse Cesarean Section   SURGEON:  Dr. Nilda Simmer   INDICATIONS: This is a 25yo [id number] at 73 wga requiring cesarean section secondary to decreased amniotic fluid, history of prior C section.   Decision made to proceed with LTCS. The risks of cesarean section discussed with the patient included but were not limited to: bleeding which may require transfusion or reoperation; infection which may require antibiotics; injury to bowel, bladder, ureters or other surrounding organs; injury to the fetus; need for additional procedures including hysterectomy in the event of a life-threatening hemorrhage; placental abnormalities wth subsequent pregnancies, incisional problems, thromboembolic phenomenon and other postoperative/anesthesia complications. The patient agreed with the proposed plan, giving informed consent for the procedure.     FINDINGS:  Viable female infant in vertex presentation, APGARs 9,9,  Weight pending, Amniotic fluid clear and scant,  Intact placenta, three vessel cord.  Grossly normal uterus  .   ANESTHESIA:    Epidural ESTIMATED BLOOD LOSS: 361 SPECIMENS: Placenta for labor and delivery COMPLICATIONS: None immediate    PROCEDURE IN DETAIL:  The patient received intravenous antibiotics (gent/clinda) and had sequential compression devices applied to her lower extremities while in the preoperative area.  She was then taken to the operating room where epidural anesthesia was dosed up to surgical level and was found to be adequate. She was then placed in a dorsal supine position with a leftward tilt, and prepped and draped in a sterile manner.  A foley catheter was placed into her bladder and attached to constant gravity.  After an adequate timeout was performed, a Pfannenstiel skin incision was made with  scalpel and carried through to the underlying layer of fascia. The fascia was incised in the midline and this incision was extended bilaterally using the Mayo scissors. Kocher clamps were applied to the superior aspect of the fascial incision and the underlying rectus muscles were dissected off bluntly. Moderate  amount of scar tissue was noted. A similar process was carried out on the inferior aspect of the facial incision. The rectus muscles were separated in the midline bluntly and the peritoneum was entered bluntly.  A bladder flap was created sharply and developed bluntly. A transverse hysterotomy was made with a scalpel and extended bilaterally bluntly. The bladder blade was then removed. The infant was successfully delivered, and cord was clamped and cut and infant was handed over to awaiting neonatology team. Uterine massage was then administered and the placenta delivered intact with three-vessel cord. Cord gases were taken. The uterus was cleared of clot and debris.  The hysterotomy was closed with 0 vicryl.  A second imbricating suture of 0-vicryl was used to reinforce the incision and aid in hemostasis.The fascia was closed with 0-Vicryl in a running fashion with good restoration of anatomy.  The subcutaneus tissue was irrigated and was reapproximated using running plain gut stitches.  The skin was closed with 4-0 Vicryl in a subcuticular fashion.  All surgical site and was hemostatic at end of procedure without any further bleeding on exam.    Pt tolerated the procedure well. All sponge/lap/needle counts were correct  X 2. Pt taken to recovery room in stable condition.    Nilda Simmer MD

## 2020-09-15 DIAGNOSIS — L245 Irritant contact dermatitis due to other chemical products: Secondary | ICD-10-CM | POA: Diagnosis not present

## 2020-09-23 DIAGNOSIS — Z1389 Encounter for screening for other disorder: Secondary | ICD-10-CM | POA: Diagnosis not present

## 2020-09-23 DIAGNOSIS — Z124 Encounter for screening for malignant neoplasm of cervix: Secondary | ICD-10-CM | POA: Diagnosis not present

## 2020-10-01 DIAGNOSIS — E1165 Type 2 diabetes mellitus with hyperglycemia: Secondary | ICD-10-CM | POA: Diagnosis not present

## 2020-10-01 DIAGNOSIS — E02 Subclinical iodine-deficiency hypothyroidism: Secondary | ICD-10-CM | POA: Diagnosis not present

## 2020-10-05 DIAGNOSIS — E221 Hyperprolactinemia: Secondary | ICD-10-CM | POA: Diagnosis not present

## 2020-10-05 DIAGNOSIS — E1165 Type 2 diabetes mellitus with hyperglycemia: Secondary | ICD-10-CM | POA: Diagnosis not present

## 2020-10-05 DIAGNOSIS — E02 Subclinical iodine-deficiency hypothyroidism: Secondary | ICD-10-CM | POA: Diagnosis not present

## 2020-11-17 DIAGNOSIS — M5459 Other low back pain: Secondary | ICD-10-CM | POA: Diagnosis not present

## 2020-11-17 DIAGNOSIS — M79604 Pain in right leg: Secondary | ICD-10-CM | POA: Diagnosis not present

## 2020-12-18 DIAGNOSIS — M79604 Pain in right leg: Secondary | ICD-10-CM | POA: Diagnosis not present

## 2020-12-18 DIAGNOSIS — M5459 Other low back pain: Secondary | ICD-10-CM | POA: Diagnosis not present

## 2020-12-29 ENCOUNTER — Emergency Department (HOSPITAL_COMMUNITY)
Admission: EM | Admit: 2020-12-29 | Discharge: 2020-12-29 | Disposition: A | Discharge status: Home / Self Care | Payer: BC Managed Care – PPO | Attending: Emergency Medicine | Admitting: Emergency Medicine

## 2020-12-29 ENCOUNTER — Encounter (HOSPITAL_COMMUNITY): Payer: Self-pay | Admitting: Emergency Medicine

## 2020-12-29 DIAGNOSIS — I10 Essential (primary) hypertension: Secondary | ICD-10-CM | POA: Insufficient documentation

## 2020-12-29 DIAGNOSIS — M5431 Sciatica, right side: Secondary | ICD-10-CM

## 2020-12-29 DIAGNOSIS — O99891 Other specified diseases and conditions complicating pregnancy: Secondary | ICD-10-CM | POA: Diagnosis not present

## 2020-12-29 DIAGNOSIS — M5441 Lumbago with sciatica, right side: Secondary | ICD-10-CM | POA: Insufficient documentation

## 2020-12-29 DIAGNOSIS — M545 Low back pain, unspecified: Secondary | ICD-10-CM | POA: Diagnosis not present

## 2020-12-29 DIAGNOSIS — Z7984 Long term (current) use of oral hypoglycemic drugs: Secondary | ICD-10-CM | POA: Insufficient documentation

## 2020-12-29 DIAGNOSIS — Z3A01 Less than 8 weeks gestation of pregnancy: Secondary | ICD-10-CM | POA: Diagnosis not present

## 2020-12-29 DIAGNOSIS — E119 Type 2 diabetes mellitus without complications: Secondary | ICD-10-CM | POA: Insufficient documentation

## 2020-12-29 MED ORDER — HYDROMORPHONE HCL 1 MG/ML IJ SOLN
1.0000 mg | Freq: Once | INTRAMUSCULAR | Status: AC
  Administered 2020-12-29: 1 mg via INTRAMUSCULAR
  Filled 2020-12-29: qty 1

## 2020-12-29 MED ORDER — DEXAMETHASONE SODIUM PHOSPHATE 10 MG/ML IJ SOLN
10.0000 mg | Freq: Once | INTRAMUSCULAR | Status: AC
  Administered 2020-12-29: 10 mg via INTRAMUSCULAR
  Filled 2020-12-29: qty 1

## 2020-12-29 NOTE — ED Triage Notes (Addendum)
Patient here from home reporting lower back pain radiating down right leg. Hx of same. Muscle relaxants with no relief. Reports that "I could be pregnant".

## 2020-12-29 NOTE — ED Provider Notes (Signed)
[facility]-EMERGENCY DEPT Provider Note   CSN: [id number] Arrival date & time: 12/29/20  7793     History Chief Complaint  Patient presents with  . Back Pain    Melissa Mullins is a 25 y.o. female.  Melissa Mullins has been dealing with back pain for several months.  This morning she developed shooting pain down her right leg.  She thinks she is about [redacted] weeks pregnant, and she also has a 52-month-old infant at home.  The patient has done physical therapy for her back pain in the past.  She has an upcoming OB/GYN appointment.  She has seen an orthopedist who told her there was nothing he could do for her at this point and that the next step would be MRI.  The history is provided by the patient.  Back Pain Location:  Lumbar spine Quality:  Shooting Radiates to:  R knee, R thigh and R foot Pain severity:  Severe Pain is:  Same all the time Onset quality:  Gradual Duration: 4. Timing:  Constant Progression:  Worsening (began having radicular pain this morning) Chronicity:  New Context: not recent illness, not recent injury and not twisting   Context comment:  Does have a 19 month old baby and does a lot of caring for her infant Relieved by:  Nothing Worsened by:  Bending and twisting Ineffective treatments:  Being still Associated symptoms: no abdominal pain, no bladder incontinence, no bowel incontinence, no chest pain, no dysuria, no fever, no numbness, no paresthesias and no weakness   Risk factors: pregnancy        Past Medical History:  Diagnosis Date  . Diabetes mellitus without complication (HCC)    Type 2 Diagnosed in 2019  . Hypertension    Pt reports intermittent elevation-not severe range following BP;s at home x 2 weeks    Patient Active Problem List   Diagnosis Date Noted  . Encounter for induction of labor 08/14/2020  . Newly diagnosed diabetes (HCC) 06/07/2018    Past Surgical History:  Procedure Laterality Date  . CESAREAN SECTION  N/A 08/14/2020   Procedure: CESAREAN SECTION;  Surgeon: Candice Camp, MD;  Location: [facility] LD ORS;  Service: Obstetrics;  Laterality: N/A;  Failed Induction gestational Hypertension  . NO PAST SURGERIES       OB History    Gravida  1   Para  1   Term  1   Preterm      AB      Living  1     SAB      IAB      Ectopic      Multiple  0   Live Births  1           No family history on file.  Social History   Tobacco Use  . Smoking status: Never Smoker  . Smokeless tobacco: Never Used  Substance Use Topics  . Alcohol use: Never  . Drug use: Never    Home Medications Prior to Admission medications   Medication Sig Start Date End Date Taking? Authorizing Provider  docusate sodium (COLACE) 100 MG capsule Take 1 capsule (100 mg total) by mouth 2 (two) times daily. 08/18/20   Ranae Pila, MD  ibuprofen (ADVIL) 600 MG tablet Take 1 tablet (600 mg total) by mouth every 6 (six) hours as needed. 08/18/20   Ranae Pila, MD  levothyroxine (SYNTHROID, LEVOTHROID) 25 MCG tablet Take 75 mcg by mouth daily before breakfast.  [provider]  metFORMIN (GLUCOPHAGE) 500 MG tablet Take 1,000 mg by mouth 2 (two) times daily with a meal.     [provider]  oxyCODONE (OXY IR/ROXICODONE) 5 MG immediate release tablet Take 1 tablet (5 mg total) by mouth every 4 (four) hours as needed for severe pain. 08/18/20   Ranae Pila, MD  Prenatal Vit-Fe Fumarate-FA (MULTIVITAMIN-PRENATAL) 27-0.8 MG TABS tablet Take 1 tablet by mouth daily at 12 noon.    [provider]    Allergies    Penicillins and Sulfa antibiotics  Review of Systems   Review of Systems  Constitutional: Negative for chills and fever.  HENT: Negative for ear pain and sore throat.   Eyes: Negative for pain and visual disturbance.  Respiratory: Negative for cough and shortness of breath.   Cardiovascular: Negative for chest pain and palpitations.  Gastrointestinal:  Negative for abdominal pain, bowel incontinence and vomiting.  Genitourinary: Negative for bladder incontinence, dysuria, hematuria and vaginal bleeding.  Musculoskeletal: Positive for back pain. Negative for arthralgias.  Skin: Negative for color change and rash.  Neurological: Negative for seizures, syncope, weakness, numbness and paresthesias.  All other systems reviewed and are negative.   Physical Exam Updated Vital Signs BP (!) 159/108 (BP Location: Left Arm)   Pulse (!) 121   Temp 97.7 F (36.5 C) (Oral)   Resp 18   SpO2 99%   Physical Exam Vitals and nursing note reviewed.  Constitutional:      Comments: Uncomfortable, tearful, frequent position changes  HENT:     Head: Normocephalic and atraumatic.  Eyes:     General: No scleral icterus. Pulmonary:     Effort: Pulmonary effort is normal. No respiratory distress.  Abdominal:     General: There is no distension.     Palpations: Abdomen is soft.     Tenderness: There is no abdominal tenderness.  Musculoskeletal:     Cervical back: Normal range of motion.     Comments: Mild tenderness to palpation at the lower lumbar spine.  She has decreased flexion and extension secondary to pain.    Skin:    General: Skin is warm and dry.  Neurological:     Mental Status: She is alert and oriented to person, place, and time.     Sensory: No sensory deficit.     Motor: No weakness.     Gait: Gait normal.  Psychiatric:        Mood and Affect: Mood normal.     ED Results / Procedures / Treatments   Labs (all labs ordered are listed, but only abnormal results are displayed) Labs Reviewed - No data to display  EKG None  Radiology No results found.  Procedures Procedures   Medications Ordered in ED Medications  dexamethasone (DECADRON) injection 10 mg (has no administration in time range)  HYDROmorphone (DILAUDID) injection 1 mg (has no administration in time range)    ED Course  I have reviewed the triage vital  signs and the nursing notes.  Pertinent labs & imaging results that were available during my care of the patient were reviewed by me and considered in my medical decision making (see chart for details).  Clinical Course as of 12/29/20 1350  Tue Dec 29, 2020  1140 Patient states her pain is now a 7 out of 10.  She requests more pain medication.  Again, we discussed the pros and cons of narcotic pain medication while pregnant.  We also discussed follow-up steps such  as chiropractic treatment as long as the chiropractor is informed about her pregnancy, acupuncture, and speaking to her spine physician about whether or not she will be a candidate for epidural injections. [AW]    Clinical Course User Index [AW] Koleen Distance, MD   MDM Rules/Calculators/A&P                          Melissa Mullins presents with radicular back pain in her first trimester pregnancy.  She has no objective signs or symptoms of emergent spinal cord pathology, and I explained to her that MRI would not change her immediate management here today.  She is pregnant, and surgical options would not likely be first-line treatment unless she did develop some sort of evidence of spinal cord compression or nerve root compression.  I told her that treatment at this point is to control the pain.  We talked about steroids as being an option for radicular symptoms.  She was in severe pain, and we did discuss the pros and cons of treatment with narcotics.  She agrees to proceed with this.  No evidence of abdominal pain, and therefore, no need to localize intrauterine pregnancy.  As discussed, OB/GYN appointment is pending.  She is feeling much better and will be discharged home with the plan as discussed above. Final Clinical Impression(s) / ED Diagnoses Final diagnoses:  Sciatica of right side    Rx / DC Orders ED Discharge Orders    None       Koleen Distance, MD 12/29/20 1350

## 2020-12-30 ENCOUNTER — Other Ambulatory Visit: Payer: Self-pay | Admitting: Sports Medicine

## 2020-12-30 ENCOUNTER — Other Ambulatory Visit (HOSPITAL_COMMUNITY): Payer: Self-pay | Admitting: Sports Medicine

## 2020-12-30 DIAGNOSIS — N911 Secondary amenorrhea: Secondary | ICD-10-CM | POA: Diagnosis not present

## 2020-12-30 DIAGNOSIS — M545 Low back pain, unspecified: Secondary | ICD-10-CM

## 2020-12-31 DIAGNOSIS — E02 Subclinical iodine-deficiency hypothyroidism: Secondary | ICD-10-CM | POA: Diagnosis not present

## 2020-12-31 DIAGNOSIS — E1165 Type 2 diabetes mellitus with hyperglycemia: Secondary | ICD-10-CM | POA: Diagnosis not present

## 2020-12-31 DIAGNOSIS — Z331 Pregnant state, incidental: Secondary | ICD-10-CM | POA: Diagnosis not present

## 2021-01-05 ENCOUNTER — Encounter (HOSPITAL_COMMUNITY): Payer: Self-pay | Admitting: Radiology

## 2021-01-05 ENCOUNTER — Other Ambulatory Visit: Payer: Self-pay

## 2021-01-05 ENCOUNTER — Ambulatory Visit (HOSPITAL_COMMUNITY)
Admission: RE | Admit: 2021-01-05 | Discharge: 2021-01-05 | Disposition: A | Discharge status: Home / Self Care | Payer: BC Managed Care – PPO | Source: Ambulatory Visit | Attending: Sports Medicine | Admitting: Sports Medicine

## 2021-01-05 DIAGNOSIS — M545 Low back pain, unspecified: Secondary | ICD-10-CM | POA: Insufficient documentation

## 2021-01-12 DIAGNOSIS — M5126 Other intervertebral disc displacement, lumbar region: Secondary | ICD-10-CM | POA: Diagnosis not present

## 2021-01-18 DIAGNOSIS — Z3A09 9 weeks gestation of pregnancy: Secondary | ICD-10-CM | POA: Diagnosis not present

## 2021-01-18 DIAGNOSIS — Z3482 Encounter for supervision of other normal pregnancy, second trimester: Secondary | ICD-10-CM | POA: Diagnosis not present

## 2021-01-18 DIAGNOSIS — Z3A15 15 weeks gestation of pregnancy: Secondary | ICD-10-CM | POA: Diagnosis not present

## 2021-01-18 DIAGNOSIS — Z3A11 11 weeks gestation of pregnancy: Secondary | ICD-10-CM | POA: Diagnosis not present

## 2021-01-18 DIAGNOSIS — Z3685 Encounter for antenatal screening for Streptococcus B: Secondary | ICD-10-CM | POA: Diagnosis not present

## 2021-01-18 DIAGNOSIS — Z3481 Encounter for supervision of other normal pregnancy, first trimester: Secondary | ICD-10-CM | POA: Diagnosis not present

## 2021-01-18 LAB — OB RESULTS CONSOLE ABO/RH: RH Type: POSITIVE

## 2021-01-18 LAB — OB RESULTS CONSOLE HIV ANTIBODY (ROUTINE TESTING): HIV: NONREACTIVE

## 2021-01-18 LAB — OB RESULTS CONSOLE ANTIBODY SCREEN: Antibody Screen: NEGATIVE

## 2021-01-18 LAB — OB RESULTS CONSOLE HEPATITIS B SURFACE ANTIGEN: Hepatitis B Surface Ag: NEGATIVE

## 2021-01-18 LAB — OB RESULTS CONSOLE RPR: RPR: NONREACTIVE

## 2021-01-18 LAB — HEPATITIS C ANTIBODY: HCV Ab: NEGATIVE

## 2021-01-18 LAB — OB RESULTS CONSOLE RUBELLA ANTIBODY, IGM: Rubella: IMMUNE

## 2021-01-19 DIAGNOSIS — M5126 Other intervertebral disc displacement, lumbar region: Secondary | ICD-10-CM | POA: Diagnosis not present

## 2021-01-27 DIAGNOSIS — M5416 Radiculopathy, lumbar region: Secondary | ICD-10-CM | POA: Diagnosis not present

## 2021-01-27 DIAGNOSIS — M5126 Other intervertebral disc displacement, lumbar region: Secondary | ICD-10-CM | POA: Diagnosis not present

## 2021-02-01 DIAGNOSIS — E02 Subclinical iodine-deficiency hypothyroidism: Secondary | ICD-10-CM | POA: Diagnosis not present

## 2021-02-01 DIAGNOSIS — E1165 Type 2 diabetes mellitus with hyperglycemia: Secondary | ICD-10-CM | POA: Diagnosis not present

## 2021-02-01 DIAGNOSIS — E282 Polycystic ovarian syndrome: Secondary | ICD-10-CM | POA: Diagnosis not present

## 2021-02-01 DIAGNOSIS — E669 Obesity, unspecified: Secondary | ICD-10-CM | POA: Diagnosis not present

## 2021-02-02 DIAGNOSIS — Z113 Encounter for screening for infections with a predominantly sexual mode of transmission: Secondary | ICD-10-CM | POA: Diagnosis not present

## 2021-02-02 DIAGNOSIS — Z3481 Encounter for supervision of other normal pregnancy, first trimester: Secondary | ICD-10-CM | POA: Diagnosis not present

## 2021-02-02 DIAGNOSIS — Z3A11 11 weeks gestation of pregnancy: Secondary | ICD-10-CM | POA: Diagnosis not present

## 2021-02-02 DIAGNOSIS — Z3400 Encounter for supervision of normal first pregnancy, unspecified trimester: Secondary | ICD-10-CM | POA: Diagnosis not present

## 2021-02-02 LAB — OB RESULTS CONSOLE GC/CHLAMYDIA
Chlamydia: NEGATIVE
Gonorrhea: NEGATIVE

## 2021-02-15 DIAGNOSIS — Z3682 Encounter for antenatal screening for nuchal translucency: Secondary | ICD-10-CM | POA: Diagnosis not present

## 2021-02-15 DIAGNOSIS — Z3481 Encounter for supervision of other normal pregnancy, first trimester: Secondary | ICD-10-CM | POA: Diagnosis not present

## 2021-02-15 DIAGNOSIS — Z3A13 13 weeks gestation of pregnancy: Secondary | ICD-10-CM | POA: Diagnosis not present

## 2021-03-01 DIAGNOSIS — M5416 Radiculopathy, lumbar region: Secondary | ICD-10-CM | POA: Diagnosis not present

## 2021-03-02 DIAGNOSIS — Z3482 Encounter for supervision of other normal pregnancy, second trimester: Secondary | ICD-10-CM | POA: Diagnosis not present

## 2021-03-02 DIAGNOSIS — Z331 Pregnant state, incidental: Secondary | ICD-10-CM | POA: Diagnosis not present

## 2021-03-02 DIAGNOSIS — Z3481 Encounter for supervision of other normal pregnancy, first trimester: Secondary | ICD-10-CM | POA: Diagnosis not present

## 2021-03-02 DIAGNOSIS — Z3A15 15 weeks gestation of pregnancy: Secondary | ICD-10-CM | POA: Diagnosis not present

## 2021-03-02 DIAGNOSIS — E1165 Type 2 diabetes mellitus with hyperglycemia: Secondary | ICD-10-CM | POA: Diagnosis not present

## 2021-03-27 ENCOUNTER — Encounter (HOSPITAL_BASED_OUTPATIENT_CLINIC_OR_DEPARTMENT_OTHER): Payer: Self-pay | Admitting: Emergency Medicine

## 2021-03-27 ENCOUNTER — Other Ambulatory Visit: Payer: Self-pay

## 2021-03-27 ENCOUNTER — Emergency Department (HOSPITAL_BASED_OUTPATIENT_CLINIC_OR_DEPARTMENT_OTHER)
Admission: EM | Admit: 2021-03-27 | Discharge: 2021-03-27 | Disposition: A | Discharge status: Home / Self Care | Payer: 59 | Attending: Emergency Medicine | Admitting: Emergency Medicine

## 2021-03-27 DIAGNOSIS — I10 Essential (primary) hypertension: Secondary | ICD-10-CM | POA: Diagnosis not present

## 2021-03-27 DIAGNOSIS — M5441 Lumbago with sciatica, right side: Secondary | ICD-10-CM | POA: Diagnosis not present

## 2021-03-27 DIAGNOSIS — Z7984 Long term (current) use of oral hypoglycemic drugs: Secondary | ICD-10-CM | POA: Diagnosis not present

## 2021-03-27 DIAGNOSIS — Z3A19 19 weeks gestation of pregnancy: Secondary | ICD-10-CM | POA: Insufficient documentation

## 2021-03-27 DIAGNOSIS — O26892 Other specified pregnancy related conditions, second trimester: Secondary | ICD-10-CM | POA: Diagnosis present

## 2021-03-27 DIAGNOSIS — E119 Type 2 diabetes mellitus without complications: Secondary | ICD-10-CM | POA: Insufficient documentation

## 2021-03-27 MED ORDER — OXYCODONE HCL 5 MG PO TABS
10.0000 mg | ORAL_TABLET | Freq: Once | ORAL | Status: AC
  Administered 2021-03-27: 10 mg via ORAL
  Filled 2021-03-27: qty 2

## 2021-03-27 MED ORDER — HYDROMORPHONE HCL 2 MG PO TABS: 2.0000 mg | ORAL_TABLET | Freq: Three times a day (TID) | ORAL | 0 refills | Status: DC | PRN

## 2021-03-27 MED ORDER — PREDNISONE 10 MG PO TABS: 20.0000 mg | ORAL_TABLET | Freq: Every day | ORAL | 0 refills | Status: AC

## 2021-03-27 MED ORDER — DOCUSATE SODIUM 100 MG PO CAPS: 100.0000 mg | ORAL_CAPSULE | Freq: Two times a day (BID) | ORAL | 0 refills | Status: AC

## 2021-03-27 MED ORDER — PREDNISONE 20 MG PO TABS
20.0000 mg | ORAL_TABLET | Freq: Once | ORAL | Status: AC
  Administered 2021-03-27: 20 mg via ORAL
  Filled 2021-03-27: qty 1

## 2021-03-27 MED ORDER — HYDROMORPHONE HCL 2 MG PO TABS
2.0000 mg | ORAL_TABLET | Freq: Once | ORAL | Status: DC
Start: 2021-03-27 — End: 2021-03-27
  Filled 2021-03-27: qty 1

## 2021-03-27 NOTE — Discharge Instructions (Addendum)
Please follow-up with your OB/GYN in the spine clinic on Tuesday as scheduled.

## 2021-03-27 NOTE — ED Triage Notes (Signed)
Pt has a known herniated disc L4-5, unable to have surgery as she is [redacted] weeks pregnant. Pt had epidural shot 3 weeks ago,but has worn off  and is in terrible pain.

## 2021-03-27 NOTE — ED Provider Notes (Signed)
[facility] EMERGENCY DEPT Provider Note   CSN: [id number] Arrival date & time: 03/27/21  1343     History Chief Complaint  Patient presents with   Back Pain    Melissa Mullins is a 25 y.o. female with a history of L5 disc herniation presenting emergency department with back pain.  She reports she is approximately [redacted] weeks pregnant.  She has a history of L5 disc herniation noted on MRI scan during this pregnancy, and has been seen in the spine clinic and received an epidural injection 3 weeks ago.  She said this helped significantly with her pain but abruptly wore off 2 days ago.  The pain is 10/10 in her back, worse with standing, and radiates down her right leg to her ankle.  It is better when lying flat.  It's similar to her prior sciatica pain.   Now she is bed-bound at home, unable to even get off the couch.  She lives with her husband and a 14 month old, and has 2 dogs, and says she can't manage anything with her level of pain.  She has been using tylenol alone for pain.  She denies new numbness or weakness in her legs, or bladder or bowel incontinence, or numbness around her groin.  She denies recent falls or trauma.  Her OBGYN is Dr Mitchel Honour in [place].  She has an upcoming appointment with OB and with the Neurosurgery spine clinic on Tuesday (in 3 days), but is in the ED today seeking some pain relief.  She reports in April she was seen in the ED and given IV steroids (decadron 10 mg per chart review) and IV dilaudid.  The steroids gave her substantial pain relief for nearly 3 weeks.  She reports no problems with her pregnancy thus far.   HPI     Past Medical History:  Diagnosis Date   Diabetes mellitus without complication (HCC)    Type 2 Diagnosed in 2019   Hypertension    Pt reports intermittent elevation-not severe range following BP;s at home x 2 weeks    Patient Active Problem List   Diagnosis Date Noted   Encounter for induction of labor  08/14/2020   Newly diagnosed diabetes (HCC) 06/07/2018    Past Surgical History:  Procedure Laterality Date   CESAREAN SECTION N/A 08/14/2020   Procedure: CESAREAN SECTION;  Surgeon: Candice Camp, MD;  Location: [facility] LD ORS;  Service: Obstetrics;  Laterality: N/A;  Failed Induction gestational Hypertension   NO PAST SURGERIES       OB History     Gravida  2   Para  1   Term  1   Preterm  0   AB  0   Living  1      SAB  0   IAB  0   Ectopic  0   Multiple      Live Births  1           No family history on file.  Social History   Tobacco Use   Smoking status: Never   Smokeless tobacco: Never  Substance Use Topics   Alcohol use: Never   Drug use: Never    Home Medications Prior to Admission medications   Medication Sig Start Date End Date Taking? Authorizing Provider  docusate sodium (COLACE) 100 MG capsule Take 1 capsule (100 mg total) by mouth 2 (two) times daily. To prevent constipation from dilaudid/opioids 03/27/21 04/26/21 Yes Jesiah Grismer, Kermit Balo, MD  HYDROmorphone (DILAUDID)  2 MG tablet Take 1 tablet (2 mg total) by mouth every 8 (eight) hours as needed for up to 12 doses for severe pain. 03/27/21  Yes Navina Wohlers, Kermit Balo, MD  predniSONE (DELTASONE) 10 MG tablet Take 2 tablets (20 mg total) by mouth daily with breakfast for 6 days. 03/28/21 04/03/21 Yes Dermot Gremillion, Kermit Balo, MD  docusate sodium (COLACE) 100 MG capsule Take 1 capsule (100 mg total) by mouth 2 (two) times daily. 08/18/20   Ranae Pila, MD  ibuprofen (ADVIL) 600 MG tablet Take 1 tablet (600 mg total) by mouth every 6 (six) hours as needed. 08/18/20   Ranae Pila, MD  levothyroxine (SYNTHROID, LEVOTHROID) 25 MCG tablet Take 75 mcg by mouth daily before breakfast.     [provider]  metFORMIN (GLUCOPHAGE) 500 MG tablet Take 1,000 mg by mouth 2 (two) times daily with a meal.     [provider]  oxyCODONE (OXY IR/ROXICODONE) 5 MG immediate release tablet Take 1 tablet  (5 mg total) by mouth every 4 (four) hours as needed for severe pain. 08/18/20   Ranae Pila, MD  Prenatal Vit-Fe Fumarate-FA (MULTIVITAMIN-PRENATAL) 27-0.8 MG TABS tablet Take 1 tablet by mouth daily at 12 noon.    [provider]    Allergies    Penicillins, Sulfa antibiotics, Penicillins, and Sulfa antibiotics  Review of Systems   Review of Systems  Constitutional:  Negative for chills and fever.  HENT:  Negative for sore throat.   Respiratory:  Negative for cough and shortness of breath.   Cardiovascular:  Negative for chest pain and palpitations.  Gastrointestinal:  Negative for abdominal pain and vomiting.  Genitourinary:  Negative for difficulty urinating and dysuria.  Musculoskeletal:  Positive for back pain. Negative for arthralgias.  Skin:  Negative for color change and rash.  Neurological:  Negative for weakness and numbness.  All other systems reviewed and are negative.  Physical Exam Updated Vital Signs BP 131/75   Pulse 88   Temp 98.2 F (36.8 C) (Oral)   Resp 16   SpO2 100%   Physical Exam Constitutional:      General: She is not in acute distress. HENT:     Head: Normocephalic and atraumatic.  Eyes:     Conjunctiva/sclera: Conjunctivae normal.     Pupils: Pupils are equal, round, and reactive to light.  Cardiovascular:     Rate and Rhythm: Normal rate and regular rhythm.  Pulmonary:     Effort: Pulmonary effort is normal. No respiratory distress.  Abdominal:     General: There is no distension.     Tenderness: There is no abdominal tenderness.     Comments: Gravid uterus  Skin:    General: Skin is warm and dry.  Neurological:     General: No focal deficit present.     Mental Status: She is alert and oriented to person, place, and time. Mental status is at baseline.     Sensory: No sensory deficit.     Motor: No weakness.     Comments: No saddle anesthesia  Psychiatric:        Mood and Affect: Mood normal.        Behavior:  Behavior normal.    ED Results / Procedures / Treatments   Labs (all labs ordered are listed, but only abnormal results are displayed) Labs Reviewed - No data to display  EKG None  Radiology No results found.  Procedures Procedures   Medications Ordered in ED Medications  predniSONE (DELTASONE) tablet 20 mg (20 mg Oral Given 03/27/21 1825)  oxyCODONE (Oxy IR/ROXICODONE) immediate release tablet 10 mg (10 mg Oral Given 03/27/21 1825)    ED Course  I have reviewed the triage vital signs and the nursing notes.  Pertinent labs & imaging results that were available during my care of the patient were reviewed by me and considered in my medical decision making (see chart for details).  Patient presenting with recurrence of her right sided sciatica pain, with known disc herniation and L5 impingement on MRI scan.  She is neurologically intact.  No red flags for cauda equina syndrome.  She has excellent strength in her lower extremities, but right leg raise is limited due to pain.  Her sensation is intact.  I discussed the case with the oncall OBGYN provider as noted below, regarding the risks and benefits of additional systemic steroids and narcotic medications.  The patient is clear that she needs some level of relief, as she is effectively bed-bound in her house and has another small child to care for.  In light of this fact, we agreed to a trial of low-dose prednisone and also PO dilaudid for a short period of time, until she can be clinically reassessed in 3 days.  Hopefully she may be a candidate for further epidural injections.  Clinical Course as of 03/28/21 1044  Sat Mar 27, 2021  1948 Patient is feeling better with her medications.  She is able to sit up.  She reports the edge is off.  She is able to ambulate.  Will d/c with meds [MT]  1952 Spoke to Dr Belva Agee from [facility] of [place] on call, reviewed the patient's case.  We discussed pain management options, which are  difficult.  She advised a lower dose of steroids, 20 mg of prednisone daily until the patient can be seen in the office on Tuesday and it can be decided on a taper.  She would avoid higher dose steroids or Decadron at this time.  She also advised her to be okay to give the patient Dilaudid tablets for pain in a short course.  She felt that the benefits of both medications outweigh the fetal risk.  I discussed his recommendations with the patient and her husband.  They are aware that there are still some risks to the pregnancy and fetal development for any use of narcotic or steroids (compared to no medications), but both agree that the benefits will likely outweigh these risks. [MT]    Clinical Course User Index [MT] Terald Sleeper, MD    Final Clinical Impression(s) / ED Diagnoses Final diagnoses:  Acute right-sided low back pain with right-sided sciatica    Rx / DC Orders ED Discharge Orders          Ordered    predniSONE (DELTASONE) 10 MG tablet  Daily with breakfast        03/27/21 1951    HYDROmorphone (DILAUDID) 2 MG tablet  Every 8 hours PRN        03/27/21 1951    docusate sodium (COLACE) 100 MG capsule  2 times daily        03/27/21 1951             Terald Sleeper, MD 03/28/21 1045

## 2021-07-22 LAB — OB RESULTS CONSOLE GBS: GBS: POSITIVE

## 2021-08-04 ENCOUNTER — Encounter (HOSPITAL_COMMUNITY)
Admission: RE | Admit: 2021-08-04 | Discharge: 2021-08-04 | Disposition: A | Discharge status: Home / Self Care | Payer: 59 | Source: Ambulatory Visit | Attending: Obstetrics and Gynecology | Admitting: Obstetrics and Gynecology

## 2021-08-04 ENCOUNTER — Other Ambulatory Visit: Payer: Self-pay

## 2021-08-04 ENCOUNTER — Encounter (HOSPITAL_COMMUNITY): Payer: Self-pay | Admitting: *Deleted

## 2021-08-04 DIAGNOSIS — Z98891 History of uterine scar from previous surgery: Secondary | ICD-10-CM | POA: Insufficient documentation

## 2021-08-04 DIAGNOSIS — Z01812 Encounter for preprocedural laboratory examination: Secondary | ICD-10-CM | POA: Insufficient documentation

## 2021-08-04 DIAGNOSIS — Z20822 Contact with and (suspected) exposure to covid-19: Secondary | ICD-10-CM | POA: Insufficient documentation

## 2021-08-04 LAB — CBC
HCT: 39 % (ref 36.0–46.0)
Hemoglobin: 13.4 g/dL (ref 12.0–15.0)
MCH: 30.7 pg (ref 26.0–34.0)
MCHC: 34.4 g/dL (ref 30.0–36.0)
MCV: 89.2 fL (ref 80.0–100.0)
Platelets: 257 10*3/uL (ref 150–400)
RBC: 4.37 MIL/uL (ref 3.87–5.11)
RDW: 13.7 % (ref 11.5–15.5)
WBC: 12.4 10*3/uL — ABNORMAL HIGH (ref 4.0–10.5)
nRBC: 0 % (ref 0.0–0.2)

## 2021-08-04 LAB — RESP PANEL BY RT-PCR (FLU A&B, COVID) ARPGX2
Influenza A by PCR: NEGATIVE
Influenza B by PCR: NEGATIVE
SARS Coronavirus 2 by RT PCR: NEGATIVE

## 2021-08-04 LAB — TYPE AND SCREEN
ABO/RH(D): AB POS
Antibody Screen: NEGATIVE

## 2021-08-04 NOTE — Patient Instructions (Signed)
Melissa Mullins  08/04/2021   Your procedure is scheduled on:  08/06/21  Arrive at 0745 at Entrance C on [address] at [facility] and [employer]. You are invited to use the FREE valet parking or use the  Visitor's parking deck.  Pick up the phone at the desk and dial [phone].  Call this number if you have problems the morning of surgery: [phone]   Remember:   Do not eat food:(After Midnight) Desps de medianoche.  Do not drink clear liquids: (After Midnight) Desps de medianoche.  Take these medicines the morning of surgery with A SIP OF WATER:  Take meds as prescribed   Do not wear jewelry, make-up or nail polish.  Do not wear lotions, powders, or perfumes. Do not wear deodorant.  Do not shave 48 hours prior to surgery.  Do not bring valuables to the hospital.  [facility] is not   responsible for any belongings or valuables brought to the hospital.  Contacts, dentures or bridgework may not be worn into surgery.  Leave suitcase in the car. After surgery it may be brought to your room.  For patients admitted to the hospital, checkout time is 11:00 AM the day of              discharge.      Please read over the following fact sheets that you were given: Preparing for Surgery

## 2021-08-05 LAB — RPR: RPR Ser Ql: NONREACTIVE

## 2021-08-05 MED ORDER — CLINDAMYCIN PHOSPHATE 900 MG/50ML IV SOLN
900.0000 mg | INTRAVENOUS | Status: AC
  Administered 2021-08-06: 900 mg via INTRAVENOUS

## 2021-08-05 MED ORDER — GENTAMICIN SULFATE 40 MG/ML IJ SOLN
5.0000 mg/kg | INTRAVENOUS | Status: AC
  Administered 2021-08-06: 365.2 mg via INTRAVENOUS
  Filled 2021-08-05: qty 9.25

## 2021-08-05 NOTE — H&P (Addendum)
OB History and Physical   Melissa Mullins is a 25 y.o. female G2P1001 presenting for scheduled repeat C section at [redacted]w[redacted]d for history of prior C section and AFI < 3rd %ile.  Pregnancy course is also notable for A2GDM on insulin and metformin and hypothyroidism.  She reports good fetal movement, denies LOF, Ctx, VB.  GBS positive, Rh positive. Low risk female.    OB History     Gravida  2   Para  1   Term  1   Preterm  0   AB  0   Living  1      SAB  0   IAB  0   Ectopic  0   Multiple      Live Births  1          Past Medical History:  Diagnosis Date   Diabetes mellitus without complication (HCC)    Type 2 Diagnosed in 2019   Herniated disc    Hypertension    Pt reports intermittent elevation-not severe range following BP;s at home x 2 weeks   Hypothyroidism    PCOS (polycystic ovarian syndrome)    Past Surgical History:  Procedure Laterality Date   CESAREAN SECTION N/A 08/14/2020   Procedure: CESAREAN SECTION;  Surgeon: Candice Camp, MD;  Location: [facility] LD ORS;  Service: Obstetrics;  Laterality: N/A;  Failed Induction gestational Hypertension   IVF     Family History: family history is not on file. Social History:  reports that she has never smoked. She has never used smokeless tobacco. She reports that she does not drink alcohol and does not use drugs.     Maternal Diabetes: Yes:  Diabetes Type:  Insulin/Medication controlled Genetic Screening: Normal Maternal Ultrasounds/Referrals: Normal Fetal Ultrasounds or other Referrals:  None Maternal Substance Abuse:  No Significant Maternal Medications:  Synthroid, metformin, insulin Significant Maternal Lab Results:  Group B Strep positive Other Comments:  None  Review of Systems Denies fever, chills, SOB, CP. History   unknown if currently breastfeeding. Exam Physical Exam  Gen: alert, well appearing, no distress Chest: nonlabored breathing CV: no peripheral edema Abdomen: soft, nontender Ext: no  evidence of DVT  Prenatal labs: ABO, Rh: --/--/AB POS (11/23 1350) Antibody: NEG (11/23 1350) Rubella: Immune (05/09 0000) RPR: NON REACTIVE (11/23 1430)  HBsAg: Negative (05/09 0000)  HIV: Non-reactive (05/09 0000)  GBS: Positive/-- (11/10 0000)   Assessment/Plan: Admit to Labor and Delivery Proceed as planned. Consented for C section, discussed risks which include but are not limited to bleeding, infection, damage to nearby organs, need for additional procedure.  Consent signed, will proceed as planned.    Lyn Henri 08/05/2021, 5:20 PM

## 2021-08-06 ENCOUNTER — Other Ambulatory Visit: Payer: Self-pay

## 2021-08-06 ENCOUNTER — Inpatient Hospital Stay (HOSPITAL_COMMUNITY): Payer: 59 | Admitting: Anesthesiology

## 2021-08-06 ENCOUNTER — Encounter (HOSPITAL_COMMUNITY)
Admission: AD | Disposition: A | Discharge status: Home / Self Care | Payer: Self-pay | Source: Home / Self Care | Attending: Obstetrics and Gynecology

## 2021-08-06 ENCOUNTER — Inpatient Hospital Stay (HOSPITAL_COMMUNITY)
Admission: AD | Admit: 2021-08-06 | Discharge: 2021-08-08 | DRG: 788 | Disposition: A | Discharge status: Home / Self Care | Payer: 59 | Attending: Obstetrics and Gynecology | Admitting: Obstetrics and Gynecology

## 2021-08-06 ENCOUNTER — Encounter (HOSPITAL_COMMUNITY): Payer: Self-pay | Admitting: Obstetrics and Gynecology

## 2021-08-06 DIAGNOSIS — O34211 Maternal care for low transverse scar from previous cesarean delivery: Secondary | ICD-10-CM | POA: Diagnosis present

## 2021-08-06 DIAGNOSIS — Z3490 Encounter for supervision of normal pregnancy, unspecified, unspecified trimester: Secondary | ICD-10-CM

## 2021-08-06 DIAGNOSIS — O24424 Gestational diabetes mellitus in childbirth, insulin controlled: Secondary | ICD-10-CM | POA: Diagnosis present

## 2021-08-06 DIAGNOSIS — O99214 Obesity complicating childbirth: Secondary | ICD-10-CM | POA: Diagnosis present

## 2021-08-06 DIAGNOSIS — Z3A37 37 weeks gestation of pregnancy: Secondary | ICD-10-CM | POA: Diagnosis not present

## 2021-08-06 DIAGNOSIS — Z20822 Contact with and (suspected) exposure to covid-19: Secondary | ICD-10-CM | POA: Diagnosis present

## 2021-08-06 DIAGNOSIS — O99824 Streptococcus B carrier state complicating childbirth: Secondary | ICD-10-CM | POA: Diagnosis present

## 2021-08-06 DIAGNOSIS — Z98891 History of uterine scar from previous surgery: Secondary | ICD-10-CM

## 2021-08-06 HISTORY — DX: Hypothyroidism, unspecified: E03.9

## 2021-08-06 HISTORY — DX: Other cervical disc displacement, unspecified cervical region: M50.20

## 2021-08-06 HISTORY — DX: Polycystic ovarian syndrome: E28.2

## 2021-08-06 LAB — GLUCOSE, CAPILLARY
Glucose-Capillary: 110 mg/dL — ABNORMAL HIGH (ref 70–99)
Glucose-Capillary: 83 mg/dL (ref 70–99)

## 2021-08-06 SURGERY — Surgical Case
Anesthesia: Spinal

## 2021-08-06 MED ORDER — INSULIN ASPART 100 UNIT/ML IJ SOLN
4.0000 [IU] | Freq: Three times a day (TID) | INTRAMUSCULAR | Status: DC
  Administered 2021-08-06: 4 [IU] via SUBCUTANEOUS

## 2021-08-06 MED ORDER — ONDANSETRON HCL 4 MG/2ML IJ SOLN
INTRAMUSCULAR | Status: AC
  Filled 2021-08-06: qty 2

## 2021-08-06 MED ORDER — TRANEXAMIC ACID-NACL 1000-0.7 MG/100ML-% IV SOLN
INTRAVENOUS | Status: DC | PRN
  Administered 2021-08-06: 1000 mg via INTRAVENOUS

## 2021-08-06 MED ORDER — OXYTOCIN-SODIUM CHLORIDE 30-0.9 UT/500ML-% IV SOLN
INTRAVENOUS | Status: AC
  Filled 2021-08-06: qty 500

## 2021-08-06 MED ORDER — COCONUT OIL OIL: 1.0000 "application " | TOPICAL_OIL | Status: DC | PRN

## 2021-08-06 MED ORDER — LACTATED RINGERS IV SOLN: INTRAVENOUS | Status: DC

## 2021-08-06 MED ORDER — SOD CITRATE-CITRIC ACID 500-334 MG/5ML PO SOLN: 30.0000 mL | ORAL | Status: DC

## 2021-08-06 MED ORDER — KETOROLAC TROMETHAMINE 30 MG/ML IJ SOLN
INTRAMUSCULAR | Status: AC
  Filled 2021-08-06: qty 1

## 2021-08-06 MED ORDER — IBUPROFEN 600 MG PO TABS
600.0000 mg | ORAL_TABLET | Freq: Four times a day (QID) | ORAL | Status: DC
  Administered 2021-08-06 – 2021-08-08 (×8): 600 mg via ORAL
  Filled 2021-08-06 (×8): qty 1

## 2021-08-06 MED ORDER — NALBUPHINE HCL 10 MG/ML IJ SOLN: 5.0000 mg | Freq: Once | INTRAMUSCULAR | Status: DC | PRN

## 2021-08-06 MED ORDER — FENTANYL CITRATE (PF) 100 MCG/2ML IJ SOLN
INTRAMUSCULAR | Status: DC | PRN
  Administered 2021-08-06: 15 ug via INTRATHECAL

## 2021-08-06 MED ORDER — ACETAMINOPHEN 10 MG/ML IV SOLN
INTRAVENOUS | Status: AC
  Filled 2021-08-06: qty 100

## 2021-08-06 MED ORDER — MEPERIDINE HCL 25 MG/ML IJ SOLN: 6.2500 mg | INTRAMUSCULAR | Status: DC | PRN

## 2021-08-06 MED ORDER — PRENATAL MULTIVITAMIN CH
1.0000 | ORAL_TABLET | Freq: Every day | ORAL | Status: DC
  Administered 2021-08-07 – 2021-08-08 (×2): 1 via ORAL
  Filled 2021-08-06 (×2): qty 1

## 2021-08-06 MED ORDER — DEXAMETHASONE SODIUM PHOSPHATE 4 MG/ML IJ SOLN
INTRAMUSCULAR | Status: DC | PRN
  Administered 2021-08-06: 4 mg via INTRAVENOUS

## 2021-08-06 MED ORDER — MENTHOL 3 MG MT LOZG: 1.0000 | LOZENGE | OROMUCOSAL | Status: DC | PRN

## 2021-08-06 MED ORDER — SODIUM CHLORIDE 0.9 % IR SOLN
Status: DC | PRN
  Administered 2021-08-06: 1

## 2021-08-06 MED ORDER — PHENYLEPHRINE HCL-NACL 20-0.9 MG/250ML-% IV SOLN
INTRAVENOUS | Status: DC | PRN
  Administered 2021-08-06: 60 ug/min via INTRAVENOUS

## 2021-08-06 MED ORDER — CEFAZOLIN SODIUM-DEXTROSE 2-4 GM/100ML-% IV SOLN: 2.0000 g | INTRAVENOUS | Status: DC

## 2021-08-06 MED ORDER — NALOXONE HCL 4 MG/10ML IJ SOLN
1.0000 ug/kg/h | INTRAVENOUS | Status: DC | PRN
  Filled 2021-08-06: qty 5

## 2021-08-06 MED ORDER — SCOPOLAMINE 1 MG/3DAYS TD PT72
1.0000 | MEDICATED_PATCH | TRANSDERMAL | Status: DC
  Administered 2021-08-06: 1.5 mg via TRANSDERMAL

## 2021-08-06 MED ORDER — SENNOSIDES-DOCUSATE SODIUM 8.6-50 MG PO TABS
2.0000 | ORAL_TABLET | Freq: Every day | ORAL | Status: DC
  Administered 2021-08-07: 2 via ORAL
  Filled 2021-08-06: qty 2

## 2021-08-06 MED ORDER — METFORMIN HCL 500 MG PO TABS
1000.0000 mg | ORAL_TABLET | Freq: Two times a day (BID) | ORAL | Status: DC
  Administered 2021-08-06 – 2021-08-08 (×4): 1000 mg via ORAL
  Filled 2021-08-06 (×4): qty 2

## 2021-08-06 MED ORDER — DIPHENHYDRAMINE HCL 25 MG PO CAPS: 25.0000 mg | ORAL_CAPSULE | Freq: Four times a day (QID) | ORAL | Status: DC | PRN

## 2021-08-06 MED ORDER — DIBUCAINE (PERIANAL) 1 % EX OINT: 1.0000 "application " | TOPICAL_OINTMENT | CUTANEOUS | Status: DC | PRN

## 2021-08-06 MED ORDER — INSULIN DETEMIR 100 UNIT/ML ~~LOC~~ SOLN
15.0000 [IU] | Freq: Every day | SUBCUTANEOUS | Status: DC
  Filled 2021-08-06 (×2): qty 0.15

## 2021-08-06 MED ORDER — PROMETHAZINE HCL 25 MG/ML IJ SOLN
6.2500 mg | INTRAMUSCULAR | Status: DC | PRN
  Administered 2021-08-06: 6.25 mg via INTRAVENOUS

## 2021-08-06 MED ORDER — ZOLPIDEM TARTRATE 5 MG PO TABS: 5.0000 mg | ORAL_TABLET | Freq: Every evening | ORAL | Status: DC | PRN

## 2021-08-06 MED ORDER — ACETAMINOPHEN 325 MG PO TABS: 650.0000 mg | ORAL_TABLET | ORAL | Status: DC | PRN

## 2021-08-06 MED ORDER — FENTANYL CITRATE (PF) 100 MCG/2ML IJ SOLN
INTRAMUSCULAR | Status: AC
  Filled 2021-08-06: qty 2

## 2021-08-06 MED ORDER — CLINDAMYCIN PHOSPHATE 900 MG/50ML IV SOLN
INTRAVENOUS | Status: AC
  Filled 2021-08-06: qty 50

## 2021-08-06 MED ORDER — ONDANSETRON HCL 4 MG/2ML IJ SOLN
INTRAMUSCULAR | Status: DC | PRN
  Administered 2021-08-06: 4 mg via INTRAVENOUS

## 2021-08-06 MED ORDER — OXYCODONE HCL 5 MG/5ML PO SOLN: 5.0000 mg | Freq: Once | ORAL | Status: DC | PRN

## 2021-08-06 MED ORDER — DOCUSATE SODIUM 100 MG PO CAPS
100.0000 mg | ORAL_CAPSULE | Freq: Two times a day (BID) | ORAL | Status: DC
  Administered 2021-08-06 – 2021-08-08 (×3): 100 mg via ORAL
  Filled 2021-08-06 (×4): qty 1

## 2021-08-06 MED ORDER — NALBUPHINE HCL 10 MG/ML IJ SOLN: 5.0000 mg | INTRAMUSCULAR | Status: DC | PRN

## 2021-08-06 MED ORDER — OXYTOCIN-SODIUM CHLORIDE 30-0.9 UT/500ML-% IV SOLN: 2.5000 [IU]/h | INTRAVENOUS | Status: AC

## 2021-08-06 MED ORDER — OXYTOCIN-SODIUM CHLORIDE 30-0.9 UT/500ML-% IV SOLN
INTRAVENOUS | Status: DC | PRN
  Administered 2021-08-06: 300 mL via INTRAVENOUS

## 2021-08-06 MED ORDER — INSULIN ASPART 100 UNIT/ML IJ SOLN
0.0000 [IU] | Freq: Three times a day (TID) | INTRAMUSCULAR | Status: DC
  Administered 2021-08-06: 3 [IU] via SUBCUTANEOUS

## 2021-08-06 MED ORDER — MORPHINE SULFATE (PF) 0.5 MG/ML IJ SOLN
INTRAMUSCULAR | Status: AC
  Filled 2021-08-06: qty 10

## 2021-08-06 MED ORDER — SCOPOLAMINE 1 MG/3DAYS TD PT72
MEDICATED_PATCH | TRANSDERMAL | Status: AC
  Filled 2021-08-06: qty 1

## 2021-08-06 MED ORDER — OXYCODONE HCL 5 MG PO TABS
5.0000 mg | ORAL_TABLET | ORAL | Status: DC | PRN
  Administered 2021-08-07 – 2021-08-08 (×3): 5 mg via ORAL
  Filled 2021-08-06 (×3): qty 1

## 2021-08-06 MED ORDER — POVIDONE-IODINE 10 % EX SWAB
2.0000 "application " | Freq: Once | CUTANEOUS | Status: AC
  Administered 2021-08-06: 2 via TOPICAL

## 2021-08-06 MED ORDER — WITCH HAZEL-GLYCERIN EX PADS: 1.0000 "application " | MEDICATED_PAD | CUTANEOUS | Status: DC | PRN

## 2021-08-06 MED ORDER — PROMETHAZINE HCL 25 MG/ML IJ SOLN
INTRAMUSCULAR | Status: AC
  Filled 2021-08-06: qty 1

## 2021-08-06 MED ORDER — PHENYLEPHRINE HCL-NACL 20-0.9 MG/250ML-% IV SOLN
INTRAVENOUS | Status: AC
  Filled 2021-08-06: qty 250

## 2021-08-06 MED ORDER — DEXAMETHASONE SODIUM PHOSPHATE 4 MG/ML IJ SOLN
INTRAMUSCULAR | Status: AC
  Filled 2021-08-06: qty 1

## 2021-08-06 MED ORDER — KETOROLAC TROMETHAMINE 30 MG/ML IJ SOLN
30.0000 mg | Freq: Once | INTRAMUSCULAR | Status: AC | PRN
  Administered 2021-08-06: 30 mg via INTRAVENOUS

## 2021-08-06 MED ORDER — DIPHENHYDRAMINE HCL 25 MG PO CAPS: 25.0000 mg | ORAL_CAPSULE | ORAL | Status: DC | PRN

## 2021-08-06 MED ORDER — SIMETHICONE 80 MG PO CHEW
80.0000 mg | CHEWABLE_TABLET | Freq: Three times a day (TID) | ORAL | Status: DC
  Administered 2021-08-06 – 2021-08-08 (×4): 80 mg via ORAL
  Filled 2021-08-06 (×4): qty 1

## 2021-08-06 MED ORDER — HYDROMORPHONE HCL 1 MG/ML IJ SOLN: 0.2500 mg | INTRAMUSCULAR | Status: DC | PRN

## 2021-08-06 MED ORDER — OXYCODONE HCL 5 MG PO TABS: 5.0000 mg | ORAL_TABLET | Freq: Once | ORAL | Status: DC | PRN

## 2021-08-06 MED ORDER — DIPHENHYDRAMINE HCL 50 MG/ML IJ SOLN: 12.5000 mg | INTRAMUSCULAR | Status: DC | PRN

## 2021-08-06 MED ORDER — STERILE WATER FOR IRRIGATION IR SOLN
Status: DC | PRN
  Administered 2021-08-06: 1000 mL

## 2021-08-06 MED ORDER — MORPHINE SULFATE (PF) 0.5 MG/ML IJ SOLN
INTRAMUSCULAR | Status: DC | PRN
  Administered 2021-08-06: 150 ug via INTRATHECAL

## 2021-08-06 MED ORDER — TETANUS-DIPHTH-ACELL PERTUSSIS 5-2.5-18.5 LF-MCG/0.5 IM SUSY: 0.5000 mL | PREFILLED_SYRINGE | Freq: Once | INTRAMUSCULAR | Status: DC

## 2021-08-06 MED ORDER — BUPIVACAINE IN DEXTROSE 0.75-8.25 % IT SOLN
INTRATHECAL | Status: DC | PRN
  Administered 2021-08-06: 1.6 mL via INTRATHECAL

## 2021-08-06 MED ORDER — OXYTOCIN-SODIUM CHLORIDE 30-0.9 UT/500ML-% IV SOLN: INTRAVENOUS | Status: DC | PRN

## 2021-08-06 MED ORDER — INSULIN ASPART 100 UNIT/ML IJ SOLN: 0.0000 [IU] | Freq: Every day | INTRAMUSCULAR | Status: DC

## 2021-08-06 MED ORDER — SODIUM CHLORIDE 0.9% FLUSH: 3.0000 mL | INTRAVENOUS | Status: DC | PRN

## 2021-08-06 MED ORDER — NALOXONE HCL 0.4 MG/ML IJ SOLN: 0.4000 mg | INTRAMUSCULAR | Status: DC | PRN

## 2021-08-06 MED ORDER — SIMETHICONE 80 MG PO CHEW: 80.0000 mg | CHEWABLE_TABLET | ORAL | Status: DC | PRN

## 2021-08-06 MED ORDER — LEVOTHYROXINE SODIUM 75 MCG PO TABS
75.0000 ug | ORAL_TABLET | Freq: Every day | ORAL | Status: DC
  Administered 2021-08-07 – 2021-08-08 (×2): 75 ug via ORAL
  Filled 2021-08-06 (×2): qty 1

## 2021-08-06 MED ORDER — ACETAMINOPHEN 10 MG/ML IV SOLN
INTRAVENOUS | Status: DC | PRN
  Administered 2021-08-06: 1000 mg via INTRAVENOUS

## 2021-08-06 SURGICAL SUPPLY — 34 items
BENZOIN TINCTURE PRP APPL 2/3 (GAUZE/BANDAGES/DRESSINGS) ×3 IMPLANT
CHLORAPREP W/TINT 26ML (MISCELLANEOUS) ×3 IMPLANT
CLAMP CORD UMBIL (MISCELLANEOUS) IMPLANT
CLIP FILSHIE TUBAL LIGA STRL (Clip) IMPLANT
CLOSURE WOUND 1/2 X4 (GAUZE/BANDAGES/DRESSINGS) ×1
CLOTH BEACON ORANGE TIMEOUT ST (SAFETY) ×3 IMPLANT
DRSG OPSITE POSTOP 4X10 (GAUZE/BANDAGES/DRESSINGS) ×3 IMPLANT
ELECT REM PT RETURN 9FT ADLT (ELECTROSURGICAL) ×3
ELECTRODE REM PT RTRN 9FT ADLT (ELECTROSURGICAL) ×1 IMPLANT
EXTRACTOR VACUUM M CUP 4 TUBE (SUCTIONS) IMPLANT
EXTRACTOR VACUUM M CUP 4' TUBE (SUCTIONS)
GLOVE BIOGEL PI IND STRL 7.0 (GLOVE) ×2 IMPLANT
GLOVE BIOGEL PI IND STRL 7.5 (GLOVE) ×1 IMPLANT
GLOVE BIOGEL PI INDICATOR 7.0 (GLOVE) ×4
GLOVE BIOGEL PI INDICATOR 7.5 (GLOVE) ×2
GLOVE ECLIPSE 7.0 STRL STRAW (GLOVE) ×3 IMPLANT
GOWN STRL REUS W/TWL LRG LVL3 (GOWN DISPOSABLE) ×6 IMPLANT
HOVERMATT SINGLE USE (MISCELLANEOUS) ×3 IMPLANT
KIT ABG SYR 3ML LUER SLIP (SYRINGE) IMPLANT
NEEDLE HYPO 25X5/8 SAFETYGLIDE (NEEDLE) IMPLANT
NS IRRIG 1000ML POUR BTL (IV SOLUTION) ×3 IMPLANT
PACK C SECTION WH (CUSTOM PROCEDURE TRAY) ×3 IMPLANT
PAD OB MATERNITY 4.3X12.25 (PERSONAL CARE ITEMS) ×3 IMPLANT
STRIP CLOSURE SKIN 1/2X4 (GAUZE/BANDAGES/DRESSINGS) ×2 IMPLANT
SUT PDS AB 0 CTX 60 (SUTURE) IMPLANT
SUT PLAIN 0 NONE (SUTURE) IMPLANT
SUT PLAIN 2 0 XLH (SUTURE) ×3 IMPLANT
SUT VIC AB 0 CT1 36 (SUTURE) ×3 IMPLANT
SUT VIC AB 0 CTX 36 (SUTURE) ×6
SUT VIC AB 0 CTX36XBRD ANBCTRL (SUTURE) ×2 IMPLANT
SUT VIC AB 4-0 KS 27 (SUTURE) ×3 IMPLANT
TOWEL OR 17X24 6PK STRL BLUE (TOWEL DISPOSABLE) ×3 IMPLANT
TRAY FOLEY W/BAG SLVR 14FR LF (SET/KITS/TRAYS/PACK) ×3 IMPLANT
WATER STERILE IRR 1000ML POUR (IV SOLUTION) ×3 IMPLANT

## 2021-08-06 NOTE — Anesthesia Preprocedure Evaluation (Signed)
Anesthesia Evaluation  Patient identified by MRN, date of birth, ID band Patient awake    Reviewed: Allergy & Precautions, Patient's Chart, lab work & pertinent test results  Airway Mallampati: II  TM Distance: >3 FB Neck ROM: Full    Dental no notable dental hx.    Pulmonary neg pulmonary ROS,    Pulmonary exam normal breath sounds clear to auscultation       Cardiovascular hypertension, negative cardio ROS Normal cardiovascular exam Rhythm:Regular Rate:Normal     Neuro/Psych negative neurological ROS  negative psych ROS   GI/Hepatic negative GI ROS, Neg liver ROS,   Endo/Other  diabetes, Well Controlled, Type 2, Insulin Dependent, Oral Hypoglycemic AgentsHypothyroidism Morbid obesityBMI 43  Renal/GU negative Renal ROS  negative genitourinary   Musculoskeletal negative musculoskeletal ROS (+)   Abdominal (+) + obese,   Peds negative pediatric ROS (+)  Hematology negative hematology ROS (+) hct 38.3, plt 254   Anesthesia Other Findings   Reproductive/Obstetrics (+) Pregnancy G1P0 IVF failed induction                             Anesthesia Physical  Anesthesia Plan  ASA: III  Anesthesia Plan: Spinal   Post-op Pain Management:    Induction:   PONV Risk Score and Plan: 2 and Treatment may vary due to age or medical condition, Ondansetron and Dexamethasone  Airway Management Planned: Natural Airway and Nasal Cannula  Additional Equipment: None  Intra-op Plan:   Post-operative Plan:   Informed Consent: I have reviewed the patients History and Physical, chart, labs and discussed the procedure including the risks, benefits and alternatives for the proposed anesthesia with the patient or authorized representative who has indicated his/her understanding and acceptance.       Plan Discussed with: CRNA  Anesthesia Plan Comments:         Anesthesia Quick Evaluation

## 2021-08-06 NOTE — Op Note (Signed)
PROCEDURE DATE: 08/06/2021  PREOPERATIVE DIAGNOSIS: Decreased AFI, A2GDM, [redacted]w[redacted]d, history of prior C section x 1   POSTOPERATIVE DIAGNOSIS: The same   PROCEDURE:    Repeat Low Transverse Cesarean Section   SURGEON:  Dr. Nilda Simmer   INDICATIONS: This is a 25yo [id number] at 73 wga requiring cesarean section secondary to decreased amniotic fluid, history of prior C section.   Decision made to proceed with LTCS. The risks of cesarean section discussed with the patient included but were not limited to: bleeding which may require transfusion or reoperation; infection which may require antibiotics; injury to bowel, bladder, ureters or other surrounding organs; injury to the fetus; need for additional procedures including hysterectomy in the event of a life-threatening hemorrhage; placental abnormalities wth subsequent pregnancies, incisional problems, thromboembolic phenomenon and other postoperative/anesthesia complications. The patient agreed with the proposed plan, giving informed consent for the procedure.     FINDINGS:  Viable female infant in vertex presentation, APGARs 9,9,  Weight pending, Amniotic fluid clear and scant,  Intact placenta, three vessel cord.  Grossly normal uterus  .   ANESTHESIA:    Epidural ESTIMATED BLOOD LOSS: 361 SPECIMENS: Placenta for labor and delivery COMPLICATIONS: None immediate    PROCEDURE IN DETAIL:  The patient received intravenous antibiotics (gent/clinda) and had sequential compression devices applied to her lower extremities while in the preoperative area.  She was then taken to the operating room where epidural anesthesia was dosed up to surgical level and was found to be adequate. She was then placed in a dorsal supine position with a leftward tilt, and prepped and draped in a sterile manner.  A foley catheter was placed into her bladder and attached to constant gravity.  After an adequate timeout was performed, a Pfannenstiel skin incision was made with  scalpel and carried through to the underlying layer of fascia. The fascia was incised in the midline and this incision was extended bilaterally using the Mayo scissors. Kocher clamps were applied to the superior aspect of the fascial incision and the underlying rectus muscles were dissected off bluntly. Moderate  amount of scar tissue was noted. A similar process was carried out on the inferior aspect of the facial incision. The rectus muscles were separated in the midline bluntly and the peritoneum was entered bluntly.  A bladder flap was created sharply and developed bluntly. A transverse hysterotomy was made with a scalpel and extended bilaterally bluntly. The bladder blade was then removed. The infant was successfully delivered, and cord was clamped and cut and infant was handed over to awaiting neonatology team. Uterine massage was then administered and the placenta delivered intact with three-vessel cord. Cord gases were taken. The uterus was cleared of clot and debris.  The hysterotomy was closed with 0 vicryl.  A second imbricating suture of 0-vicryl was used to reinforce the incision and aid in hemostasis.The fascia was closed with 0-Vicryl in a running fashion with good restoration of anatomy.  The subcutaneus tissue was irrigated and was reapproximated using running plain gut stitches.  The skin was closed with 4-0 Vicryl in a subcuticular fashion.  All surgical site and was hemostatic at end of procedure without any further bleeding on exam.    Pt tolerated the procedure well. All sponge/lap/needle counts were correct  X 2. Pt taken to recovery room in stable condition.    Nilda Simmer MD

## 2021-08-06 NOTE — Anesthesia Procedure Notes (Signed)
Spinal  Patient location during procedure: OB Start time: 08/06/2021 9:24 AM End time: 08/06/2021 9:29 AM Reason for block: surgical anesthesia Staffing Performed: anesthesiologist  Anesthesiologist: Lowella Curb, MD Preanesthetic Checklist Completed: patient identified, IV checked, risks and benefits discussed, surgical consent, monitors and equipment checked, pre-op evaluation and timeout performed Spinal Block Patient position: sitting Prep: DuraPrep and site prepped and draped Patient monitoring: heart rate, cardiac monitor, continuous pulse ox and blood pressure Approach: midline Location: L3-4 Injection technique: single-shot Needle Needle type: Pencan  Needle gauge: 24 G Needle length: 10 cm Assessment Sensory level: T4 Events: CSF return

## 2021-08-06 NOTE — Transfer of Care (Signed)
Immediate Anesthesia Transfer of Care Note  Patient: Melissa Mullins  Procedure(s) Performed: REPEAT CESAREAN SECTION, EDC 08/21/21, ALLERGY TO SULFA AND PNC, PREVIOUS X 1  Patient Location: PACU  Anesthesia Type:Spinal  Level of Consciousness: awake  Airway & Oxygen Therapy: Patient Spontanous Breathing  Post-op Assessment: Report given to RN and Post -op Vital signs reviewed and stable  Post vital signs: Reviewed  Last Vitals:  Vitals Value Taken Time  BP    Temp    Pulse 85 08/06/21 1051  Resp 18 08/06/21 1051  SpO2 100 % 08/06/21 1051  Vitals shown include unvalidated device data.  Last Pain:  Vitals:   08/06/21 0813  TempSrc: Oral         Complications: No notable events documented.

## 2021-08-06 NOTE — Anesthesia Postprocedure Evaluation (Signed)
Anesthesia Post Note  Patient: Melissa Mullins  Procedure(s) Performed: REPEAT CESAREAN SECTION, EDC 08/21/21, ALLERGY TO SULFA AND PNC, PREVIOUS X 1     Patient location during evaluation: PACU Anesthesia Type: Spinal Level of consciousness: awake and alert Pain management: pain level controlled Vital Signs Assessment: post-procedure vital signs reviewed and stable Respiratory status: spontaneous breathing, nonlabored ventilation and respiratory function stable Cardiovascular status: blood pressure returned to baseline and stable Postop Assessment: no apparent nausea or vomiting Anesthetic complications: no   No notable events documented.  Last Vitals:  Vitals:   08/06/21 1155 08/06/21 1205  BP: 119/75 122/72  Pulse: 84 79  Resp:    Temp: 36.4 C 37.1 C  SpO2: 100% 97%    Last Pain:  Vitals:   08/06/21 1205  TempSrc: Oral  PainSc: 0-No pain   Pain Goal:    LLE Motor Response: Purposeful movement (08/06/21 1145)   RLE Motor Response: Purposeful movement (08/06/21 1145)       Epidural/Spinal Function Cutaneous sensation: Tingles (08/06/21 1205), Patient able to flex knees: Yes (08/06/21 1205), Patient able to lift hips off bed: Yes (08/06/21 1205), Back pain beyond tenderness at insertion site: No (08/06/21 1205), Progressively worsening motor and/or sensory loss: No (08/06/21 1205), Bowel and/or bladder incontinence post epidural: No (08/06/21 1205)  Lowella Curb

## 2021-08-07 LAB — CBC
HCT: 31.2 % — ABNORMAL LOW (ref 36.0–46.0)
Hemoglobin: 10.4 g/dL — ABNORMAL LOW (ref 12.0–15.0)
MCH: 30.1 pg (ref 26.0–34.0)
MCHC: 33.3 g/dL (ref 30.0–36.0)
MCV: 90.2 fL (ref 80.0–100.0)
Platelets: 218 10*3/uL (ref 150–400)
RBC: 3.46 MIL/uL — ABNORMAL LOW (ref 3.87–5.11)
RDW: 13.9 % (ref 11.5–15.5)
WBC: 14.8 10*3/uL — ABNORMAL HIGH (ref 4.0–10.5)
nRBC: 0 % (ref 0.0–0.2)

## 2021-08-07 NOTE — Progress Notes (Signed)
Postpartum Progress Note  Postpartum Day 1 s/p repeat Cesarean section.  Subjective:  Patient reports no overnight events.  She reports well controlled pain, ambulating without difficulty, voiding spontaneously, tolerating PO.  She reports Positive flatus, Negative BM.  Vaginal bleeding is minimal.  Objective: Blood pressure 116/63, pulse 85, temperature 98 F (36.7 C), temperature source Oral, resp. rate 16, height 5\' 2"  (1.575 m), weight 106.1 kg, SpO2 99 %, unknown if currently breastfeeding.  Physical Exam:  General: alert and no distress Lochia: appropriate Uterine Fundus: firm Incision: dressing in place DVT Evaluation: No evidence of DVT seen on physical exam.  Recent Labs    08/04/21 1430 08/07/21 0427  HGB 13.4 10.4*  HCT 39.0 31.2*    Assessment/Plan: Postpartum Day 1, s/p C-section DM on insulin: Continue metformin.  BG well controlled yesterday. Doing well, continue routine postpartum care. Anticipate discharge PPD2 or 3   LOS: 1 day   08/09/21 08/07/2021, 6:56 AM

## 2021-08-08 MED ORDER — OXYCODONE HCL 5 MG PO TABS: 5.0000 mg | ORAL_TABLET | ORAL | 0 refills | Status: DC | PRN

## 2021-08-08 MED ORDER — IBUPROFEN 600 MG PO TABS
600.0000 mg | ORAL_TABLET | Freq: Four times a day (QID) | ORAL | 0 refills | Status: DC
Start: 2021-08-08 — End: 2023-12-18

## 2021-08-08 MED ORDER — METFORMIN HCL 1000 MG PO TABS: 1000.0000 mg | ORAL_TABLET | Freq: Two times a day (BID) | ORAL | 2 refills | Status: DC

## 2021-08-08 MED ORDER — ACETAMINOPHEN 325 MG PO TABS
650.0000 mg | ORAL_TABLET | ORAL | 0 refills | Status: DC | PRN
Start: 2021-08-08 — End: 2023-12-18

## 2021-08-08 NOTE — Progress Notes (Signed)
Postpartum Progress Note  Postpartum Day 2 s/p repeat Cesarean section.  Subjective:  Patient reports no overnight events.  She reports well controlled pain, ambulating without difficulty, voiding spontaneously, tolerating PO.  She reports Positive flatus, positive  BM.  Vaginal bleeding is minimal.  Objective: Blood pressure 118/68, pulse 90, temperature 98.6 F (37 C), temperature source Oral, resp. rate 17, height 5\' 2"  (1.575 m), weight 106.1 kg, SpO2 100 %, unknown if currently breastfeeding.  Physical Exam:  General: alert and no distress Lochia: appropriate Uterine Fundus: firm Incision: dressing in place DVT Evaluation: No evidence of DVT seen on physical exam.  Recent Labs    08/07/21 0427  HGB 10.4*  HCT 31.2*    Assessment/Plan: Postpartum Day 2, s/p C-section DM on insulin: Continue metformin.  BG well controlled in 90-100s without any insulin. Will continue metformin upon discharge and pt will follow up with endocrinology Doing well, continue routine postpartum care. Anticipate discharge today   LOS: 2 days   08/09/21 08/08/2021, 8:39 AM

## 2021-08-08 NOTE — Discharge Summary (Signed)
Obstetric Discharge Summary  Melissa Mullins is a 25 y.o. female that presented on 08/06/2021 for repeat C section.  Pregnancy was complicated by A2GDM on insulin, metformin.  She was delivered at [redacted]w[redacted]d for decreased amniotic fluid.  She was admitted to labor and delivery for her procedure and delivered a viable female infant on 08/06/21.  Her postpartum course was uncomplicated and on PPD#2, she reported well controlled pain, spontaneous voiding, ambulating without difficulty, and tolerating PO. She did not require insulin postpartum but remained on metformin.  She was stable for discharge home on 08/08/21 with plans for in-office follow up as well as follow up with endocrinologist.  Hemoglobin  Date Value Ref Range Status  08/07/2021 10.4 (L) 12.0 - 15.0 g/dL Final   HCT  Date Value Ref Range Status  08/07/2021 31.2 (L) 36.0 - 46.0 % Final    Physical Exam:  General: alert Lochia: appropriate Uterine Fundus: firm Incision: healing well DVT Evaluation: No evidence of DVT seen on physical exam.  Discharge Diagnoses: Term Pregnancy-delivered  Discharge Information: Date: 08/08/2021 Activity: Pelvic rest, as tolerated Diet: routine Medications: Tylenol, motrin, oxycodone, metformin Condition: stable Instructions: Refer to practice specific booklet.  Discussed prior to discharge.  Discharge to: Home  Follow-up Information     [place] Follow up.   Why: Please follow up with your endocrinologist soon after discharge.  Then please follow up for 6 week postpartum visit. Contact information: [address] [place] [address] [phone]                 Newborn Data: Live born female  Birth Weight: 6 lb 14.1 oz (3120 g) APGAR: 9, 9  Newborn Delivery   Birth date/time: 08/06/2021 09:54:00 Delivery type: C-Section, Low Transverse Trial of labor: No C-section categorization: Repeat      Home with mother.  Lyn Henri 08/08/2021, 8:40 PM

## 2021-08-17 DIAGNOSIS — Z98891 History of uterine scar from previous surgery: Secondary | ICD-10-CM

## 2021-08-19 ENCOUNTER — Telehealth (HOSPITAL_COMMUNITY): Payer: Self-pay | Admitting: *Deleted

## 2021-08-19 NOTE — Telephone Encounter (Signed)
Phone voicemail message left to return nurse call.  Duffy Rhody, RN 08-19-2021 at 10:00am

## 2021-09-24 IMAGING — MR MR HEAD WO/W CM
16 of 19 series · 36 of 48 positions shown · IV contrast (multihance)
Comparison: None.

CLINICAL DATA: Elevated prolactin level.

EXAM:
MRI HEAD WITHOUT AND WITH CONTRAST
TECHNIQUE: Multiplanar, multiecho pulse sequences of the brain and surrounding
structures were obtained without and with intravenous contrast.
CONTRAST:  10mL MULTIHANCE GADOBENATE DIMEGLUMINE 529 MG/ML IV SOLN

[Series 2: t1_se_sag · sagittal · 5.0mm · 0.45mm/px · 1 of 19 slices shown]
[im 1/19]
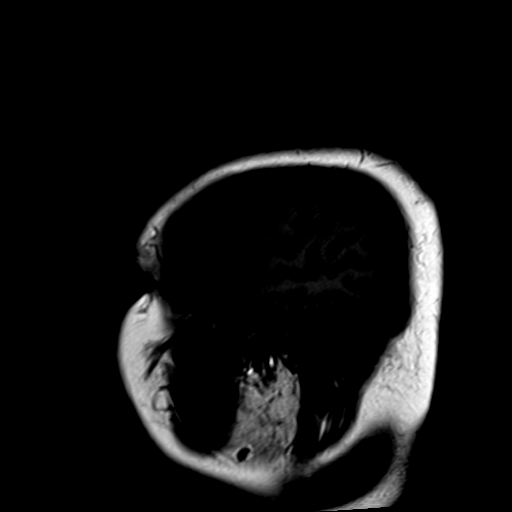

[Series 3: ep2d_diff_3 · axial · 3.0mm · 1.80mm/px · z∈[-63,+83]mm · 8 of 96 slices shown]
[im 1/96]
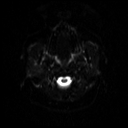
[im 11/96]
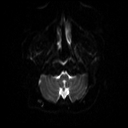
[im 32/96]
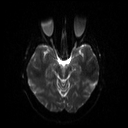
[im 43/96]
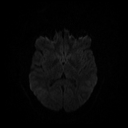
[im 53/96]
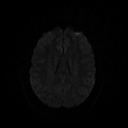
[im 64/96]
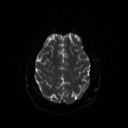
[im 85/96]
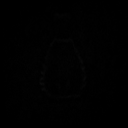
[im 96/96]
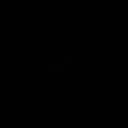

[Series 4: ep2d_diff_3_adc · axial · 3.0mm · 1.80mm/px · z∈[-63,+83]mm · 6 of 50 slices shown]
[im 1/50]
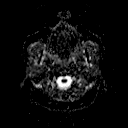
[im 10/50]
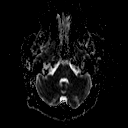
[im 20/50]
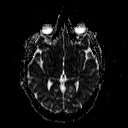
[im 30/50]
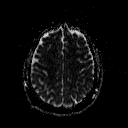
[im 40/50]
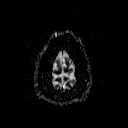
[im 50/50]
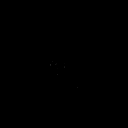

[Series 5: T2 · axial · 5.0mm · 0.45mm/px · z∈[-64,+72]mm · 2 of 22 slices shown]
[im 1/22]
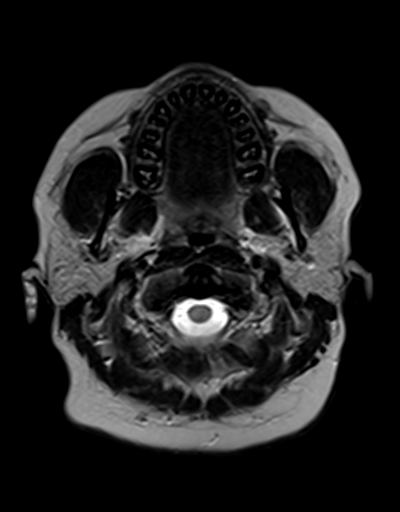
[im 22/22]
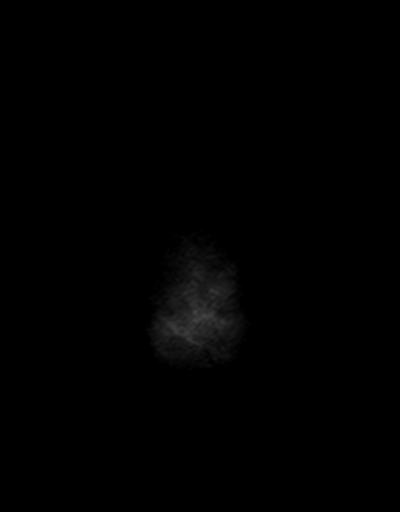

[Series 6: FLAIR · axial · 3.0mm · 0.43mm/px · z∈[-75,+81]mm · 3 of 27 slices shown]
[im 1/27]
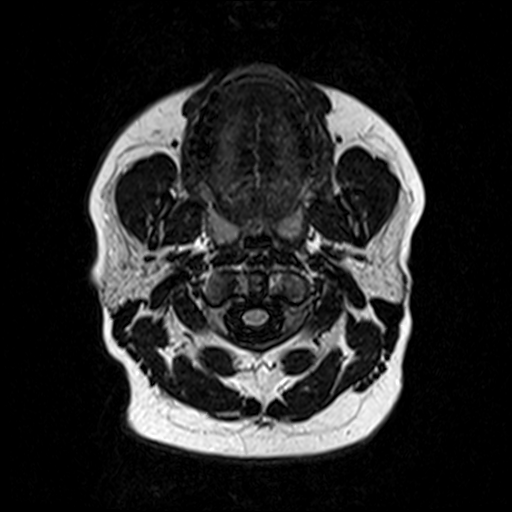
[im 14/27]
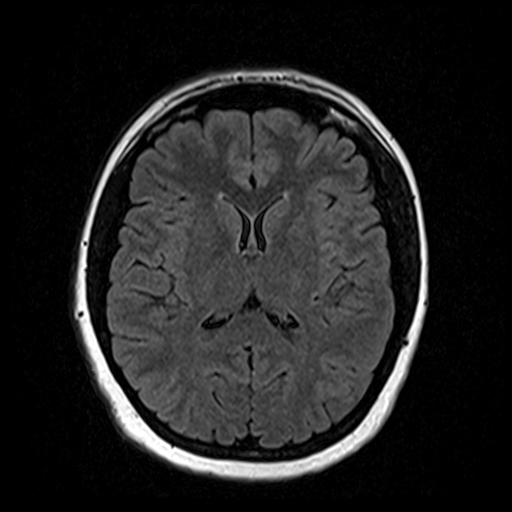
[im 27/27]
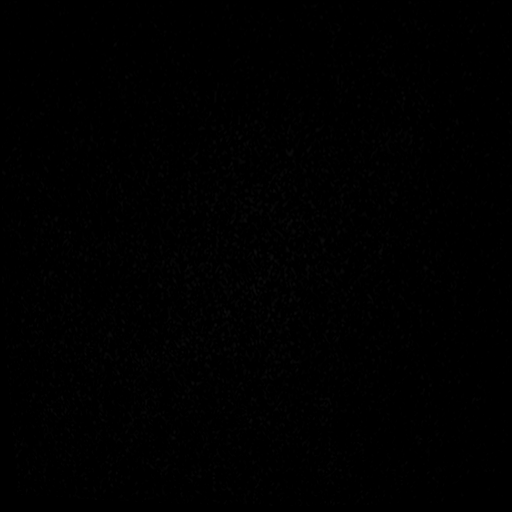

[Series 7: GRE · axial · 5.0mm · 0.45mm/px · z∈[-64,+72]mm · 2 of 22 slices shown]
[im 1/22]
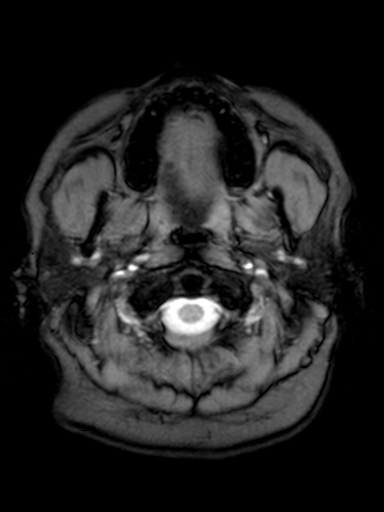
[im 22/22]
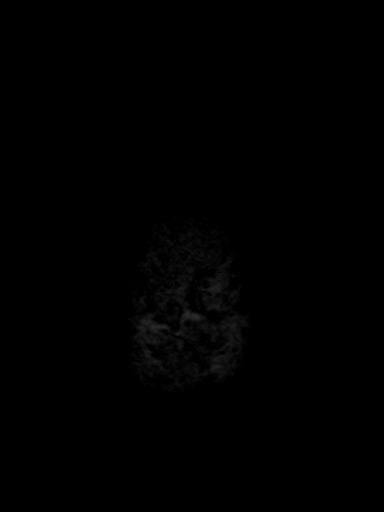

[Series 8: T1 · sagittal · 3.0mm · 0.35mm/px · 2 of 16 slices shown (1 of 2)]
[im 1/16]
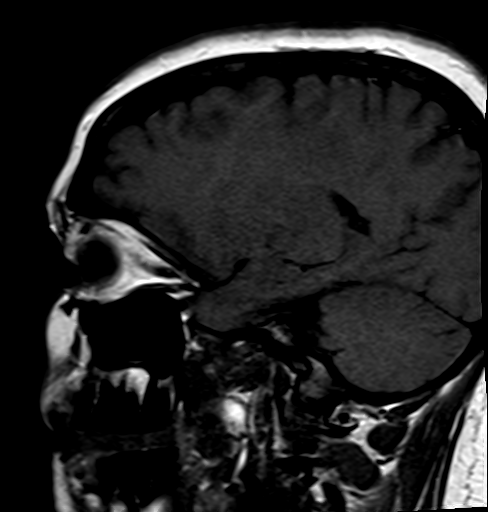
[im 16/16]
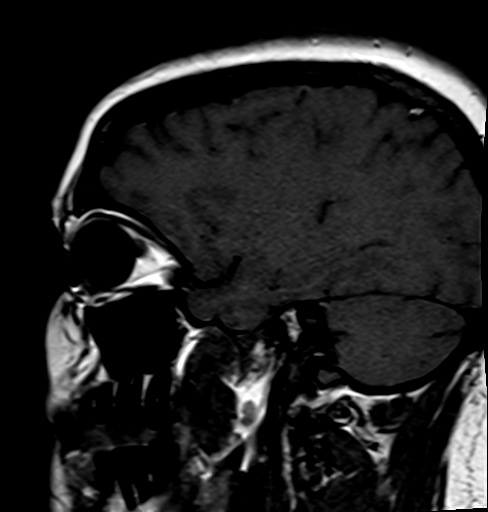

[Series 9: T1 · coronal · 3.0mm · 0.35mm/px · 1 of 11 slices shown (2 of 2)]
[im 1/11]
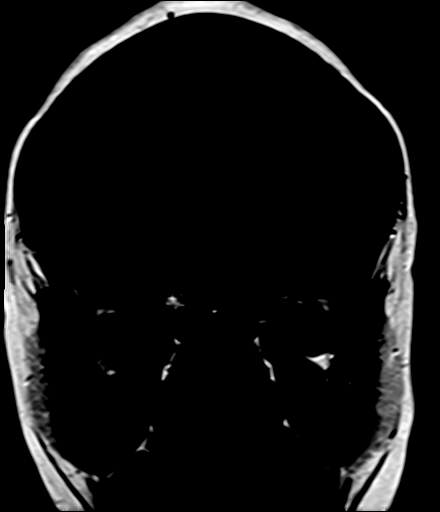

[Series 10: pre cor · coronal · non-contrast · 3.0mm · 0.35mm/px · 1 of 6 slices shown]
[im 1/6]
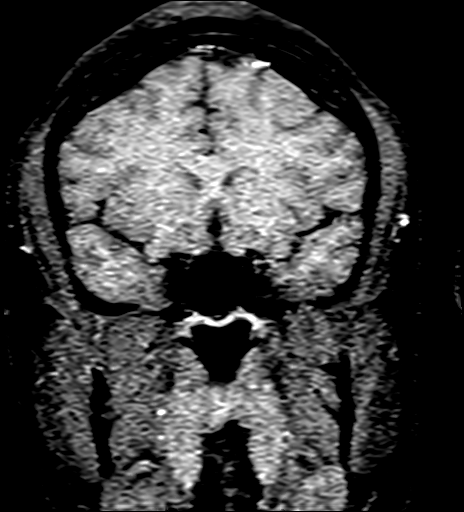

[Series 11: post cor dynamic · coronal · 3.0mm · 0.35mm/px · 1 of 6 slices shown (1 of 4)]
[im 1/6]
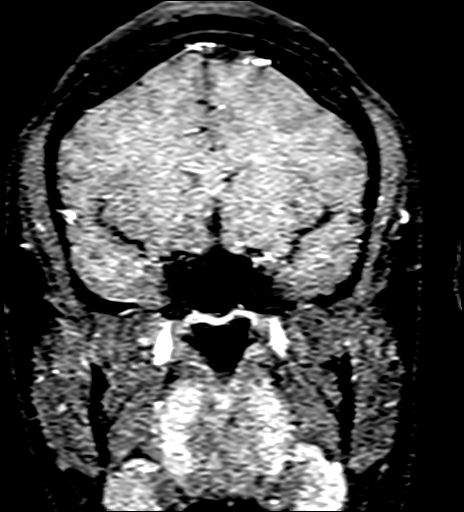

[Series 12: post cor dynamic · coronal · 3.0mm · 0.35mm/px · 1 of 6 slices shown (2 of 4)]
[im 1/6]
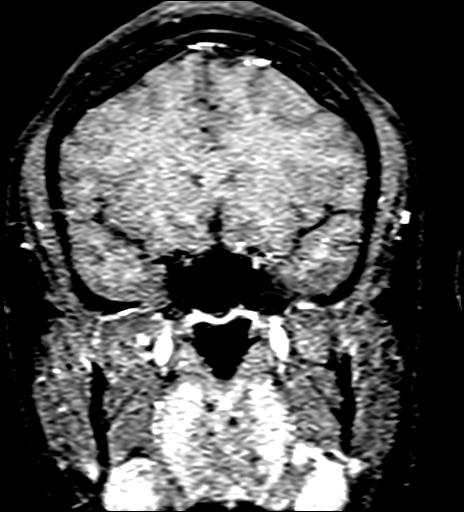

[Series 13: post cor dynamic · coronal · 3.0mm · 0.35mm/px · 1 of 6 slices shown (3 of 4)]
[im 1/6]
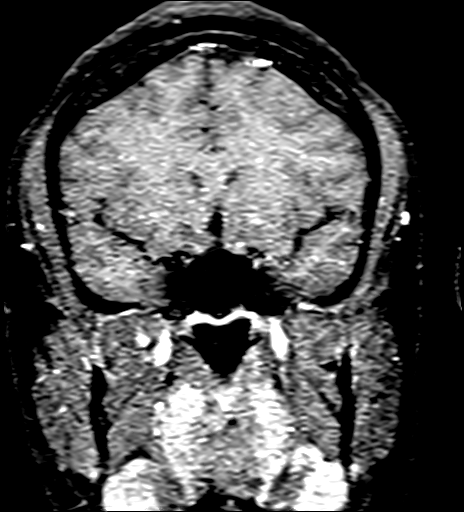

[Series 14: post cor dynamic · coronal · 3.0mm · 0.35mm/px · 1 of 6 slices shown (4 of 4)]
[im 1/6]
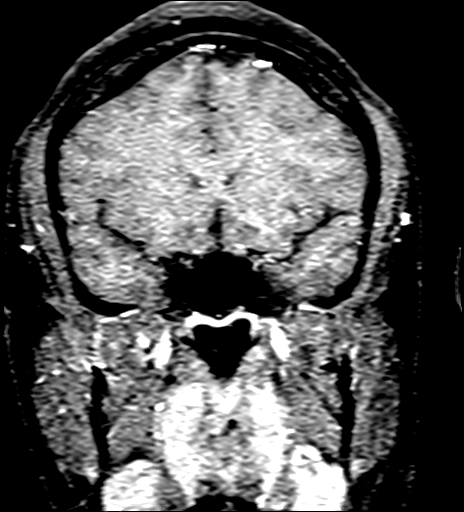

[Series 17: T1 post-contrast · coronal · 3.0mm · 0.35mm/px · 1 of 11 slices shown (1 of 3)]
[im 1/11]
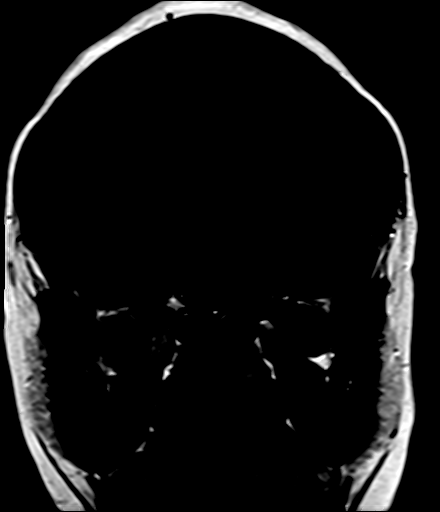

[Series 18: T1 post-contrast · sagittal · 3.0mm · 0.35mm/px · 2 of 16 slices shown (2 of 3)]
[im 1/16]
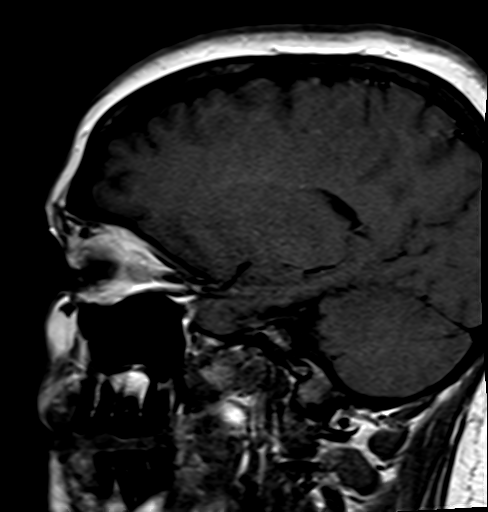
[im 16/16]
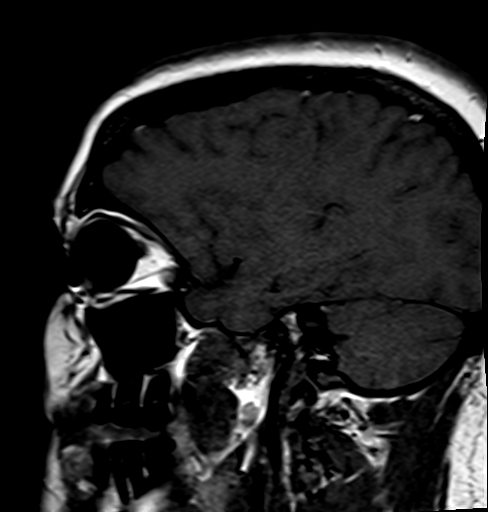

[Series 20: T1 post-contrast · coronal · 5.0mm · 0.86mm/px · 3 of 25 slices shown (3 of 3)]
[im 1/25]
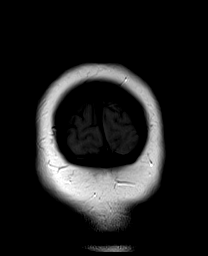
[im 13/25]
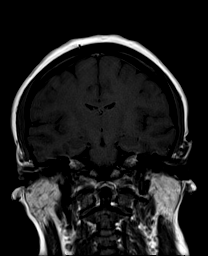
[im 25/25]
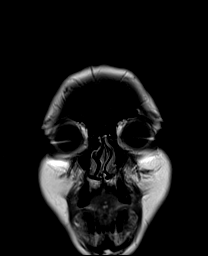

[36 of 48 positions shown; findings below may reference images not displayed]

FINDINGS: Brain: The brain has a normal appearance without evidence of
malformation, atrophy, old or acute small or large vessel
infarction, mass lesion, hemorrhage, hydrocephalus or extra-axial
collection.

Pituitary gland is normal size for age measuring 10 x 6 x 13 mm. The
upper surface is slightly convex common normal for age and sex.
After contrast administration, the enhancement pattern is normal.

Vascular: Major vessels at the base of the brain show flow. Venous
sinuses appear patent.

Skull and upper cervical spine: Normal.

Sinuses/Orbits: Clear/normal.

Other: None significant.
IMPRESSION: Normal MRI of the brain.

Normal appearance of the pituitary gland, infundibulum and
hypothalamus. No pituitary macro adenoma. No visible micro adenoma.

## 2022-06-23 DIAGNOSIS — Z6836 Body mass index (BMI) 36.0-36.9, adult: Secondary | ICD-10-CM | POA: Diagnosis not present

## 2022-06-23 DIAGNOSIS — Z6834 Body mass index (BMI) 34.0-34.9, adult: Secondary | ICD-10-CM | POA: Diagnosis not present

## 2022-06-23 DIAGNOSIS — M5416 Radiculopathy, lumbar region: Secondary | ICD-10-CM | POA: Diagnosis not present

## 2022-07-01 DIAGNOSIS — M5416 Radiculopathy, lumbar region: Secondary | ICD-10-CM | POA: Diagnosis not present

## 2023-05-31 LAB — OB RESULTS CONSOLE RUBELLA ANTIBODY, IGM: Rubella: IMMUNE

## 2023-05-31 LAB — OB RESULTS CONSOLE RPR: RPR: NONREACTIVE

## 2023-05-31 LAB — OB RESULTS CONSOLE ANTIBODY SCREEN: Antibody Screen: NEGATIVE

## 2023-05-31 LAB — HEPATITIS C ANTIBODY: HCV Ab: NEGATIVE

## 2023-05-31 LAB — OB RESULTS CONSOLE HEPATITIS B SURFACE ANTIGEN: Hepatitis B Surface Ag: NEGATIVE

## 2023-05-31 LAB — OB RESULTS CONSOLE HIV ANTIBODY (ROUTINE TESTING): HIV: NONREACTIVE

## 2023-06-12 LAB — OB RESULTS CONSOLE GC/CHLAMYDIA
Chlamydia: NEGATIVE
Neisseria Gonorrhea: NEGATIVE

## 2023-10-12 LAB — OB RESULTS CONSOLE HIV ANTIBODY (ROUTINE TESTING): HIV: NONREACTIVE

## 2023-12-11 LAB — OB RESULTS CONSOLE GBS: GBS: POSITIVE

## 2023-12-13 ENCOUNTER — Telehealth (HOSPITAL_COMMUNITY): Payer: Self-pay | Admitting: *Deleted

## 2023-12-13 ENCOUNTER — Encounter (HOSPITAL_COMMUNITY): Payer: Self-pay | Admitting: *Deleted

## 2023-12-13 ENCOUNTER — Encounter (HOSPITAL_COMMUNITY): Payer: Self-pay

## 2023-12-13 NOTE — Telephone Encounter (Signed)
 Preadmission screen

## 2023-12-13 NOTE — Patient Instructions (Signed)
 Adiva Boettner  12/13/2023   Your procedure is scheduled on:  4.14.2025  Arrive at 0530 at Entrance C on [facility] at [facility]  and [employer]. You are invited to use the FREE valet parking or use the Visitor's parking deck.  Pick up the phone at the desk and dial [phone].  Call this number if you have problems the morning of surgery: [phone]  Remember:   Do not eat food:(After Midnight) Desps de medianoche.  You may drink clear liquids until arrival at __0530___.  Clear liquids means a liquid you can see thru.  It can have color such as Cola or Kool aid.  Tea is OK and coffee as long as no milk or creamer of any kind..  Take these medicines the morning of surgery with A SIP OF WATER:  levothyroxine   Do not wear jewelry, make-up or nail polish.  Do not wear lotions, powders, or perfumes. Do not wear deodorant.  Do not shave 48 hours prior to surgery.  Do not bring valuables to the hospital.  [facility] is not   responsible for any belongings or valuables brought to the hospital.  Contacts, dentures or bridgework may not be worn into surgery.  Leave suitcase in the car. After surgery it may be brought to your room.  For patients admitted to the hospital, checkout time is 11:00 AM the day of              discharge.      Please read over the following fact sheets that you were given:     Preparing for Surgery

## 2023-12-14 ENCOUNTER — Telehealth (HOSPITAL_COMMUNITY): Payer: Self-pay | Admitting: *Deleted

## 2023-12-14 NOTE — Telephone Encounter (Signed)
 Preadmission screen

## 2023-12-15 ENCOUNTER — Encounter (HOSPITAL_COMMUNITY): Payer: Self-pay

## 2023-12-18 ENCOUNTER — Encounter (HOSPITAL_COMMUNITY): Payer: Self-pay | Admitting: Obstetrics and Gynecology

## 2023-12-18 ENCOUNTER — Telehealth (HOSPITAL_COMMUNITY): Payer: Self-pay | Admitting: *Deleted

## 2023-12-18 NOTE — Telephone Encounter (Signed)
 Called pt to discuss new CS instructions.  Arriving at 0915.  NOthing to eat after MN may drink clear liquids until arrival.  Take half of prescribed insulin at bedtime tonight and no insulin tomorrow.  Take levothyroxine as prescribed.  Questions answered and verbalized understanding

## 2023-12-19 ENCOUNTER — Inpatient Hospital Stay (HOSPITAL_COMMUNITY)
Admission: RE | Admit: 2023-12-19 | Discharge: 2023-12-21 | DRG: 786 | Disposition: A | Discharge status: Home / Self Care | Payer: Self-pay | Attending: Obstetrics and Gynecology | Admitting: Obstetrics and Gynecology

## 2023-12-19 ENCOUNTER — Other Ambulatory Visit: Payer: Self-pay

## 2023-12-19 ENCOUNTER — Inpatient Hospital Stay (HOSPITAL_COMMUNITY)

## 2023-12-19 ENCOUNTER — Encounter (HOSPITAL_COMMUNITY)
Admission: RE | Disposition: A | Discharge status: Home / Self Care | Payer: Self-pay | Source: Home / Self Care | Attending: Obstetrics and Gynecology

## 2023-12-19 ENCOUNTER — Encounter (HOSPITAL_COMMUNITY): Payer: Self-pay | Admitting: Obstetrics and Gynecology

## 2023-12-19 DIAGNOSIS — E1165 Type 2 diabetes mellitus with hyperglycemia: Secondary | ICD-10-CM | POA: Diagnosis present

## 2023-12-19 DIAGNOSIS — O34211 Maternal care for low transverse scar from previous cesarean delivery: Principal | ICD-10-CM | POA: Diagnosis present

## 2023-12-19 DIAGNOSIS — Z3A37 37 weeks gestation of pregnancy: Principal | ICD-10-CM

## 2023-12-19 DIAGNOSIS — E66813 Obesity, class 3: Secondary | ICD-10-CM | POA: Diagnosis present

## 2023-12-19 DIAGNOSIS — O99284 Endocrine, nutritional and metabolic diseases complicating childbirth: Secondary | ICD-10-CM | POA: Diagnosis present

## 2023-12-19 DIAGNOSIS — O99824 Streptococcus B carrier state complicating childbirth: Secondary | ICD-10-CM | POA: Diagnosis present

## 2023-12-19 DIAGNOSIS — O2412 Pre-existing diabetes mellitus, type 2, in childbirth: Secondary | ICD-10-CM | POA: Diagnosis present

## 2023-12-19 DIAGNOSIS — Z794 Long term (current) use of insulin: Secondary | ICD-10-CM

## 2023-12-19 DIAGNOSIS — Z98891 History of uterine scar from previous surgery: Secondary | ICD-10-CM

## 2023-12-19 DIAGNOSIS — E039 Hypothyroidism, unspecified: Secondary | ICD-10-CM | POA: Diagnosis present

## 2023-12-19 DIAGNOSIS — O99214 Obesity complicating childbirth: Secondary | ICD-10-CM | POA: Diagnosis present

## 2023-12-19 DIAGNOSIS — O34219 Maternal care for unspecified type scar from previous cesarean delivery: Secondary | ICD-10-CM

## 2023-12-19 DIAGNOSIS — Z3490 Encounter for supervision of normal pregnancy, unspecified, unspecified trimester: Secondary | ICD-10-CM

## 2023-12-19 LAB — GLUCOSE, CAPILLARY
Glucose-Capillary: 100 mg/dL — ABNORMAL HIGH (ref 70–99)
Glucose-Capillary: 109 mg/dL — ABNORMAL HIGH (ref 70–99)
Glucose-Capillary: 133 mg/dL — ABNORMAL HIGH (ref 70–99)
Glucose-Capillary: 147 mg/dL — ABNORMAL HIGH (ref 70–99)

## 2023-12-19 LAB — COMPREHENSIVE METABOLIC PANEL WITH GFR
ALT: 13 U/L (ref 0–44)
AST: 24 U/L (ref 15–41)
Albumin: 2.7 g/dL — ABNORMAL LOW (ref 3.5–5.0)
Alkaline Phosphatase: 102 U/L (ref 38–126)
Anion gap: 11 (ref 5–15)
BUN: 6 mg/dL (ref 6–20)
CO2: 20 mmol/L — ABNORMAL LOW (ref 22–32)
Calcium: 9.2 mg/dL (ref 8.9–10.3)
Chloride: 104 mmol/L (ref 98–111)
Creatinine, Ser: 0.63 mg/dL (ref 0.44–1.00)
GFR, Estimated: >60 mL/min (ref >60)
Glucose, Bld: 91 mg/dL (ref 70–99)
Potassium: 3.6 mmol/L (ref 3.5–5.1)
Sodium: 135 mmol/L (ref 135–145)
Total Bilirubin: 0.5 mg/dL (ref 0.0–1.2)
Total Protein: 6.4 g/dL — ABNORMAL LOW (ref 6.5–8.1)

## 2023-12-19 LAB — CBC
HCT: 37.3 % (ref 36.0–46.0)
Hemoglobin: 12.9 g/dL (ref 12.0–15.0)
MCH: 30.9 pg (ref 26.0–34.0)
MCHC: 34.6 g/dL (ref 30.0–36.0)
MCV: 89.4 fL (ref 80.0–100.0)
Platelets: 182 10*3/uL (ref 150–400)
RBC: 4.17 MIL/uL (ref 3.87–5.11)
RDW: 13.2 % (ref 11.5–15.5)
WBC: 11.1 10*3/uL — ABNORMAL HIGH (ref 4.0–10.5)
nRBC: 0 % (ref 0.0–0.2)

## 2023-12-19 LAB — PROTEIN / CREATININE RATIO, URINE
Creatinine, Urine: 52 mg/dL
Total Protein, Urine: <6 mg/dL

## 2023-12-19 LAB — HEMOGLOBIN A1C
Hgb A1c MFr Bld: 6 % — ABNORMAL HIGH (ref 4.8–5.6)
Mean Plasma Glucose: 125.5 mg/dL

## 2023-12-19 LAB — HIV ANTIBODY (ROUTINE TESTING W REFLEX): HIV Screen 4th Generation wRfx: NONREACTIVE

## 2023-12-19 LAB — TYPE AND SCREEN
ABO/RH(D): AB POS
Antibody Screen: NEGATIVE

## 2023-12-19 LAB — RPR: RPR Ser Ql: NONREACTIVE

## 2023-12-19 SURGERY — Surgical Case
Anesthesia: Spinal

## 2023-12-19 MED ORDER — POVIDONE-IODINE 10 % EX SWAB
2.0000 | Freq: Once | CUTANEOUS | Status: AC
  Administered 2023-12-19: 2 via TOPICAL

## 2023-12-19 MED ORDER — OXYTOCIN-SODIUM CHLORIDE 30-0.9 UT/500ML-% IV SOLN
2.5000 [IU]/h | INTRAVENOUS | Status: AC
  Administered 2023-12-19: 2.5 [IU]/h via INTRAVENOUS
  Filled 2023-12-19: qty 500

## 2023-12-19 MED ORDER — MENTHOL 3 MG MT LOZG: 1.0000 | LOZENGE | OROMUCOSAL | Status: DC | PRN

## 2023-12-19 MED ORDER — FENTANYL CITRATE (PF) 100 MCG/2ML IJ SOLN
INTRAMUSCULAR | Status: DC | PRN
  Administered 2023-12-19: 15 ug via INTRATHECAL

## 2023-12-19 MED ORDER — OXYCODONE HCL 5 MG PO TABS: 5.0000 mg | ORAL_TABLET | Freq: Once | ORAL | Status: DC | PRN

## 2023-12-19 MED ORDER — OXYCODONE HCL 5 MG PO TABS
5.0000 mg | ORAL_TABLET | Freq: Once | ORAL | Status: AC
  Administered 2023-12-19: 5 mg via ORAL
  Filled 2023-12-19: qty 1

## 2023-12-19 MED ORDER — DIPHENHYDRAMINE HCL 25 MG PO CAPS: 25.0000 mg | ORAL_CAPSULE | Freq: Four times a day (QID) | ORAL | Status: DC | PRN

## 2023-12-19 MED ORDER — AMISULPRIDE (ANTIEMETIC) 5 MG/2ML IV SOLN: 10.0000 mg | Freq: Once | INTRAVENOUS | Status: DC | PRN

## 2023-12-19 MED ORDER — OXYCODONE HCL 5 MG PO TABS
5.0000 mg | ORAL_TABLET | ORAL | Status: DC | PRN
Start: 2023-12-19 — End: 2023-12-21
  Administered 2023-12-20 (×3): 5 mg via ORAL
  Administered 2023-12-21 (×2): 10 mg via ORAL
  Filled 2023-12-19 (×2): qty 1
  Filled 2023-12-19 (×2): qty 2
  Filled 2023-12-19: qty 1

## 2023-12-19 MED ORDER — DEXAMETHASONE SODIUM PHOSPHATE 10 MG/ML IJ SOLN
INTRAMUSCULAR | Status: AC
  Filled 2023-12-19: qty 1

## 2023-12-19 MED ORDER — SODIUM CHLORIDE 0.9 % IR SOLN
Status: DC | PRN
  Administered 2023-12-19: 1000 mL

## 2023-12-19 MED ORDER — OXYTOCIN-SODIUM CHLORIDE 30-0.9 UT/500ML-% IV SOLN
INTRAVENOUS | Status: AC
  Filled 2023-12-19: qty 500

## 2023-12-19 MED ORDER — ACETAMINOPHEN 500 MG PO TABS
1000.0000 mg | ORAL_TABLET | Freq: Once | ORAL | Status: AC
  Administered 2023-12-19: 1000 mg via ORAL
  Filled 2023-12-19: qty 2

## 2023-12-19 MED ORDER — DEXAMETHASONE SODIUM PHOSPHATE 10 MG/ML IJ SOLN
INTRAMUSCULAR | Status: DC | PRN
  Administered 2023-12-19: 10 mg via INTRAVENOUS

## 2023-12-19 MED ORDER — CLINDAMYCIN PHOSPHATE 900 MG/50ML IV SOLN
INTRAVENOUS | Status: AC
  Filled 2023-12-19: qty 50

## 2023-12-19 MED ORDER — DIBUCAINE (PERIANAL) 1 % EX OINT: 1.0000 | TOPICAL_OINTMENT | CUTANEOUS | Status: DC | PRN

## 2023-12-19 MED ORDER — ONDANSETRON HCL 4 MG/2ML IJ SOLN: 4.0000 mg | Freq: Three times a day (TID) | INTRAMUSCULAR | Status: DC | PRN

## 2023-12-19 MED ORDER — PRENATAL MULTIVITAMIN CH
1.0000 | ORAL_TABLET | Freq: Every day | ORAL | Status: DC
  Administered 2023-12-20: 1 via ORAL
  Filled 2023-12-19: qty 1

## 2023-12-19 MED ORDER — SCOPOLAMINE 1 MG/3DAYS TD PT72
MEDICATED_PATCH | TRANSDERMAL | Status: AC
  Filled 2023-12-19: qty 1

## 2023-12-19 MED ORDER — SODIUM CHLORIDE 0.9% FLUSH: 3.0000 mL | INTRAVENOUS | Status: DC | PRN

## 2023-12-19 MED ORDER — NALOXONE HCL 4 MG/10ML IJ SOLN: 1.0000 ug/kg/h | INTRAVENOUS | Status: DC | PRN

## 2023-12-19 MED ORDER — CLINDAMYCIN PHOSPHATE 900 MG/50ML IV SOLN
900.0000 mg | INTRAVENOUS | Status: AC
  Administered 2023-12-19: 900 mg via INTRAVENOUS

## 2023-12-19 MED ORDER — FENTANYL CITRATE (PF) 100 MCG/2ML IJ SOLN
INTRAMUSCULAR | Status: AC
  Filled 2023-12-19: qty 2

## 2023-12-19 MED ORDER — BUPIVACAINE IN DEXTROSE 0.75-8.25 % IT SOLN
INTRATHECAL | Status: DC | PRN
  Administered 2023-12-19: 1.6 mL via INTRATHECAL

## 2023-12-19 MED ORDER — SCOPOLAMINE 1 MG/3DAYS TD PT72
1.0000 | MEDICATED_PATCH | TRANSDERMAL | Status: DC
  Administered 2023-12-19: 1.5 mg via TRANSDERMAL

## 2023-12-19 MED ORDER — ONDANSETRON HCL 4 MG/2ML IJ SOLN
INTRAMUSCULAR | Status: AC
  Filled 2023-12-19: qty 2

## 2023-12-19 MED ORDER — SIMETHICONE 80 MG PO CHEW: 80.0000 mg | CHEWABLE_TABLET | ORAL | Status: DC | PRN

## 2023-12-19 MED ORDER — LACTATED RINGERS IV SOLN: INTRAVENOUS | Status: DC

## 2023-12-19 MED ORDER — PHENYLEPHRINE HCL-NACL 20-0.9 MG/250ML-% IV SOLN
INTRAVENOUS | Status: AC
  Filled 2023-12-19: qty 250

## 2023-12-19 MED ORDER — LEVOTHYROXINE SODIUM 75 MCG PO TABS
75.0000 ug | ORAL_TABLET | Freq: Every day | ORAL | Status: DC
  Administered 2023-12-20 – 2023-12-21 (×2): 75 ug via ORAL
  Filled 2023-12-19 (×2): qty 1

## 2023-12-19 MED ORDER — INSULIN ASPART 100 UNIT/ML IJ SOLN
0.0000 [IU] | Freq: Three times a day (TID) | INTRAMUSCULAR | Status: DC
  Administered 2023-12-19: 2 [IU] via SUBCUTANEOUS
  Administered 2023-12-20: 3 [IU] via SUBCUTANEOUS
  Administered 2023-12-20: 2 [IU] via SUBCUTANEOUS
  Administered 2023-12-21: 0 [IU] via SUBCUTANEOUS

## 2023-12-19 MED ORDER — ONDANSETRON HCL 4 MG/2ML IJ SOLN
INTRAMUSCULAR | Status: DC | PRN
  Administered 2023-12-19: 4 mg via INTRAVENOUS

## 2023-12-19 MED ORDER — CIPROFLOXACIN IN D5W 400 MG/200ML IV SOLN
400.0000 mg | INTRAVENOUS | Status: DC
  Filled 2023-12-19: qty 200

## 2023-12-19 MED ORDER — ACETAMINOPHEN 10 MG/ML IV SOLN
INTRAVENOUS | Status: DC | PRN
  Administered 2023-12-19: 1000 mg via INTRAVENOUS

## 2023-12-19 MED ORDER — SODIUM CHLORIDE 0.9 % IV SOLN
INTRAVENOUS | Status: DC | PRN
  Administered 2023-12-19: 350 mg via INTRAVENOUS

## 2023-12-19 MED ORDER — STERILE WATER FOR IRRIGATION IR SOLN
Status: DC | PRN
  Administered 2023-12-19: 1000 mL

## 2023-12-19 MED ORDER — WITCH HAZEL-GLYCERIN EX PADS: 1.0000 | MEDICATED_PAD | CUTANEOUS | Status: DC | PRN

## 2023-12-19 MED ORDER — COCONUT OIL OIL: 1.0000 | TOPICAL_OIL | Status: DC | PRN

## 2023-12-19 MED ORDER — GENTAMICIN SULFATE 40 MG/ML IJ SOLN
5.0000 mg/kg | Freq: Once | INTRAVENOUS | Status: DC
  Filled 2023-12-19: qty 8.75

## 2023-12-19 MED ORDER — PHENYLEPHRINE HCL-NACL 20-0.9 MG/250ML-% IV SOLN
INTRAVENOUS | Status: DC | PRN
  Administered 2023-12-19: 30 ug/min via INTRAVENOUS

## 2023-12-19 MED ORDER — ACETAMINOPHEN 10 MG/ML IV SOLN: 1000.0000 mg | Freq: Once | INTRAVENOUS | Status: DC | PRN

## 2023-12-19 MED ORDER — MEPERIDINE HCL 25 MG/ML IJ SOLN: 6.2500 mg | INTRAMUSCULAR | Status: DC | PRN

## 2023-12-19 MED ORDER — DIPHENHYDRAMINE HCL 50 MG/ML IJ SOLN: 12.5000 mg | Freq: Four times a day (QID) | INTRAMUSCULAR | Status: DC | PRN

## 2023-12-19 MED ORDER — ACETAMINOPHEN 325 MG PO TABS
650.0000 mg | ORAL_TABLET | ORAL | Status: DC | PRN
  Administered 2023-12-20 – 2023-12-21 (×3): 650 mg via ORAL
  Filled 2023-12-19 (×4): qty 2

## 2023-12-19 MED ORDER — NALOXONE HCL 0.4 MG/ML IJ SOLN: 0.4000 mg | INTRAMUSCULAR | Status: DC | PRN

## 2023-12-19 MED ORDER — ZOLPIDEM TARTRATE 5 MG PO TABS: 5.0000 mg | ORAL_TABLET | Freq: Every evening | ORAL | Status: DC | PRN

## 2023-12-19 MED ORDER — MORPHINE SULFATE (PF) 0.5 MG/ML IJ SOLN
INTRAMUSCULAR | Status: AC
  Filled 2023-12-19: qty 10

## 2023-12-19 MED ORDER — SODIUM CHLORIDE 0.9 % IR SOLN
Status: DC | PRN
  Administered 2023-12-19: 1

## 2023-12-19 MED ORDER — FENTANYL CITRATE (PF) 100 MCG/2ML IJ SOLN
25.0000 ug | INTRAMUSCULAR | Status: DC | PRN
Start: 2023-12-19 — End: 2023-12-19

## 2023-12-19 MED ORDER — INSULIN ASPART 100 UNIT/ML IJ SOLN
4.0000 [IU] | Freq: Three times a day (TID) | INTRAMUSCULAR | Status: DC
  Administered 2023-12-19 – 2023-12-21 (×5): 4 [IU] via SUBCUTANEOUS

## 2023-12-19 MED ORDER — OXYTOCIN-SODIUM CHLORIDE 30-0.9 UT/500ML-% IV SOLN
INTRAVENOUS | Status: DC | PRN
  Administered 2023-12-19: 300 mL via INTRAVENOUS

## 2023-12-19 MED ORDER — MORPHINE SULFATE (PF) 0.5 MG/ML IJ SOLN
INTRAMUSCULAR | Status: DC | PRN
  Administered 2023-12-19: 150 ug via INTRATHECAL

## 2023-12-19 MED ORDER — IBUPROFEN 600 MG PO TABS
600.0000 mg | ORAL_TABLET | Freq: Four times a day (QID) | ORAL | Status: DC
  Administered 2023-12-19 – 2023-12-21 (×7): 600 mg via ORAL
  Filled 2023-12-19 (×7): qty 1

## 2023-12-19 MED ORDER — KETOROLAC TROMETHAMINE 30 MG/ML IJ SOLN: 30.0000 mg | Freq: Once | INTRAMUSCULAR | Status: DC | PRN

## 2023-12-19 MED ORDER — SENNOSIDES-DOCUSATE SODIUM 8.6-50 MG PO TABS
2.0000 | ORAL_TABLET | Freq: Every day | ORAL | Status: DC
  Administered 2023-12-20 – 2023-12-21 (×2): 2 via ORAL
  Filled 2023-12-19 (×2): qty 2

## 2023-12-19 MED ORDER — OXYCODONE HCL 5 MG/5ML PO SOLN: 5.0000 mg | Freq: Once | ORAL | Status: DC | PRN

## 2023-12-19 MED ORDER — SIMETHICONE 80 MG PO CHEW
80.0000 mg | CHEWABLE_TABLET | Freq: Three times a day (TID) | ORAL | Status: DC
  Administered 2023-12-19 – 2023-12-21 (×4): 80 mg via ORAL
  Filled 2023-12-19 (×4): qty 1

## 2023-12-19 SURGICAL SUPPLY — 31 items
BARRIER ADHS 3X4 INTERCEED (GAUZE/BANDAGES/DRESSINGS) IMPLANT
BENZOIN TINCTURE PRP APPL 2/3 (GAUZE/BANDAGES/DRESSINGS) IMPLANT
CHLORAPREP W/TINT 26 (MISCELLANEOUS) ×2 IMPLANT
CLAMP UMBILICAL CORD (MISCELLANEOUS) ×1 IMPLANT
CLOTH BEACON ORANGE TIMEOUT ST (SAFETY) ×1 IMPLANT
DRSG OPSITE POSTOP 4X10 (GAUZE/BANDAGES/DRESSINGS) ×1 IMPLANT
ELECT REM PT RETURN 9FT ADLT (ELECTROSURGICAL) ×1 IMPLANT
ELECTRODE REM PT RTRN 9FT ADLT (ELECTROSURGICAL) ×1 IMPLANT
EXTRACTOR VACUUM M CUP 4 TUBE (SUCTIONS) IMPLANT
GLOVE BIO SURGEON STRL SZ 6.5 (GLOVE) ×1 IMPLANT
GLOVE BIOGEL PI IND STRL 7.0 (GLOVE) ×1 IMPLANT
GOWN STRL REUS W/TWL LRG LVL3 (GOWN DISPOSABLE) ×2 IMPLANT
KIT ABG SYR 3ML LUER SLIP (SYRINGE) IMPLANT
MAT PREVALON FULL STRYKER (MISCELLANEOUS) IMPLANT
NDL HYPO 25X5/8 SAFETYGLIDE (NEEDLE) ×1 IMPLANT
NEEDLE HYPO 22GX1.5 SAFETY (NEEDLE) IMPLANT
NEEDLE HYPO 25X5/8 SAFETYGLIDE (NEEDLE) ×1 IMPLANT
NS IRRIG 1000ML POUR BTL (IV SOLUTION) ×1 IMPLANT
PACK C SECTION WH (CUSTOM PROCEDURE TRAY) ×1 IMPLANT
PAD OB MATERNITY 4.3X12.25 (PERSONAL CARE ITEMS) ×1 IMPLANT
STRIP CLOSURE SKIN 1/2X4 (GAUZE/BANDAGES/DRESSINGS) IMPLANT
SUT CHROMIC 0 CTX 36 (SUTURE) ×2 IMPLANT
SUT PLAIN 0 NONE (SUTURE) IMPLANT
SUT PLAIN 2 0 XLH (SUTURE) IMPLANT
SUT VIC AB 0 CT1 27XBRD ANBCTR (SUTURE) ×3 IMPLANT
SUT VIC AB 4-0 KS 27 (SUTURE) IMPLANT
SYR CONTROL 10ML LL (SYRINGE) IMPLANT
TOWEL OR 17X24 6PK STRL BLUE (TOWEL DISPOSABLE) ×1 IMPLANT
TRAY FOLEY W/BAG SLVR 14FR LF (SET/KITS/TRAYS/PACK) ×1 IMPLANT
VACUUM CUP M-STYLE MYSTIC II (SUCTIONS) IMPLANT
WATER STERILE IRR 1000ML POUR (IV SOLUTION) ×1 IMPLANT

## 2023-12-19 NOTE — Transfer of Care (Signed)
 Immediate Anesthesia Transfer of Care Note  Patient: Melissa Mullins  Procedure(s) Performed: REPEAT CESAREAN SECTION EDC: 01-06-24 ALLERG: PENICILLINS, SULFA  PREVIOUS X 2  Patient Location: PACU  Anesthesia Type:Spinal  Level of Consciousness: awake  Airway & Oxygen Therapy: Patient Spontanous Breathing  Post-op Assessment: Report given to RN and Post -op Vital signs reviewed and stable  Post vital signs: Reviewed and stable  Last Vitals:  Vitals Value Taken Time  BP 126/56 12/19/23 1246  Temp    Pulse 81 12/19/23 1249  Resp 17 12/19/23 1249  SpO2 99 % 12/19/23 1249  Vitals shown include unfiled device data.  Last Pain:  Vitals:   12/19/23 0955  TempSrc: Oral         Complications: No notable events documented.

## 2023-12-19 NOTE — Anesthesia Postprocedure Evaluation (Signed)
 Anesthesia Post Note  Patient: Melissa Mullins  Procedure(s) Performed: REPEAT CESAREAN SECTION EDC: 01-06-24 ALLERG: PENICILLINS, SULFA  PREVIOUS X 2     Patient location during evaluation: PACU Anesthesia Type: Spinal Level of consciousness: awake Pain management: pain level controlled Vital Signs Assessment: post-procedure vital signs reviewed and stable Respiratory status: spontaneous breathing, nonlabored ventilation and respiratory function stable Cardiovascular status: blood pressure returned to baseline and stable Postop Assessment: no apparent nausea or vomiting Anesthetic complications: no   No notable events documented.  Last Vitals:  Vitals:   12/19/23 1355 12/19/23 1420  BP:  131/70  Pulse: 62 60  Resp: 13 16  Temp:  (!) 36.4 C  SpO2: 99% 99%    Last Pain:  Vitals:   12/19/23 1420  TempSrc: Oral  PainSc: 0-No pain   Pain Goal:                Epidural/Spinal Function Cutaneous sensation: Tingles (12/19/23 1420), Patient able to flex knees: No (12/19/23 1420), Patient able to lift hips off bed: No (12/19/23 1420), Back pain beyond tenderness at insertion site: No (12/19/23 1420), Progressively worsening motor and/or sensory loss: No (12/19/23 1420)  Larnce Schnackenberg P Danika Kluender

## 2023-12-19 NOTE — Inpatient Diabetes Management (Signed)
 Inpatient Diabetes Program Recommendations  Diabetes Treatment Program Recommendations  ADA Standards of Care Diabetes in Pregnancy Target Glucose Ranges:  Fasting: 70 - 95 mg/dL 1 hr postprandial: Less than 140mg /dL (from first bite of meal) 2 hr postprandial: Less than 120 mg/dL (from first bite of meal)    Lab Results  Component Value Date   GLUCAP 100 (H) 12/19/2023   HGBA1C 4.8 08/14/2020    Review of Glycemic Control  Latest Reference Range & Units 12/19/23 09:51  Glucose-Capillary 70 - 99 mg/dL 161 (H)  (H): Data is abnormally high Diabetes history: Type 2 DM Outpatient Diabetes medications: Novolin N 10 units QA, 36 units QP, Novolog 30 units TID Prior to pregnancy: Differing medications.  Current orders for Inpatient glycemic control: none  Inpatient Diabetes Program Recommendations:    Noted consult. Sees Dr Talmage Nap, outpatient endocrinology.   Attempted to speak with patient in periop. Not available due to case. Noted plan for sliding scale.   Follow.   Thanks, Lujean Rave, MSN, RNC-OB Diabetes Coordinator [phone] (8a-5p)

## 2023-12-19 NOTE — H&P (Signed)
 Melissa Mullins is a 28 y.o. G 3 P 2004 at 37 weeks and 3 days presents for Repeat C Section. She has had two C Sections.  Risks factors: 1. Group B Strep Carrier  2. History of intervertebral disc prolapse - has had injections prior 3. Type 2 DM - followed by Dr Talmage Nap - Normal ECHO  4. Hypothyroidism Patient was seen in the office yesterday . Baby 6 pound 10 oz. DM poorly controlled. Dr Lorane Gell Discussed with MFM Dr Judeth Cornfield and recommend 37 week delivery. I am doing the C section today as the on call provider.  OB History     Gravida  3   Para  2   Term  2   Preterm  0   AB  0   Living  2      SAB  0   IAB  0   Ectopic  0   Multiple  0   Live Births  2          Past Medical History:  Diagnosis Date   Diabetes mellitus without complication (HCC)    Type 2 Diagnosed in 2019   Gestational diabetes    Herniated disc    Hypothyroidism    PCOS (polycystic ovarian syndrome)    PONV (postoperative nausea and vomiting)    Past Surgical History:  Procedure Laterality Date   CESAREAN SECTION N/A 08/14/2020   Procedure: CESAREAN SECTION;  Surgeon: Candice Camp, MD;  Location: [facility] LD ORS;  Service: Obstetrics;  Laterality: N/A;  Failed Induction gestational Hypertension   CESAREAN SECTION N/A 08/06/2021   Procedure: REPEAT CESAREAN SECTION, EDC 08/21/21, ALLERGY TO SULFA AND PNC, PREVIOUS X 1;  Surgeon: Lyn Henri, MD;  Location: [facility] LD ORS;  Service: Obstetrics;  Laterality: N/A;   IVF     Family History: family history includes Hyperthyroidism in her mother. Social History:  reports that she has never smoked. She has never used smokeless tobacco. She reports that she does not drink alcohol and does not use drugs.     Maternal Diabetes: Yes:  Diabetes Type:  Insulin/Medication controlled Genetic Screening: Normal Maternal Ultrasounds/Referrals: Normal Fetal Ultrasounds or other Referrals:  Fetal echo Maternal Substance Abuse:  No Significant Maternal  Medications:  Meds include: Other:  Significant Maternal Lab Results:  Group B Strep positive Number of Prenatal Visits:greater than 3 verified prenatal visits Maternal Vaccinations:Flu Other Comments:  None  Review of Systems History   unknown if currently breastfeeding. Maternal Exam:  Uterine Assessment: Contraction frequency is rare.  Abdomen: Fetal presentation: vertex   Physical Exam Vitals and nursing note reviewed. Exam conducted with a chaperone present.  Constitutional:      Appearance: Normal appearance.  HENT:     Head: Normocephalic.     Right Ear: Tympanic membrane normal.  Cardiovascular:     Rate and Rhythm: Normal rate and regular rhythm.  Pulmonary:     Effort: Pulmonary effort is normal.  Musculoskeletal:     Cervical back: Normal range of motion.  Neurological:     Mental Status: She is alert.     Prenatal labs: ABO, Rh:   Antibody: Negative (09/18 0000) Rubella: Immune (09/18 0000) RPR: Nonreactive (09/18 0000)  HBsAg: Negative (09/18 0000)  HIV: Non-reactive (01/30 0000)  GBS: Positive/-- (03/31 0000)   Assessment/Plan: IUP at 37 weeks and 3 days  Previous C Section x 2  Poorly controlled Type 2 DM Repeat C Section Sliding scale insulin for glucose control   Marcelino Duster  L Tyronn Golda 12/19/2023, 9:49 AM

## 2023-12-19 NOTE — Brief Op Note (Signed)
 12/19/2023  12:38 PM  PATIENT:  Melissa Mullins  28 y.o. female  PRE-OPERATIVE DIAGNOSIS:   IUP at 25 w 3 days  Previous Cesarean Section x 2  Type 2 Diabetes Mellitus  POST-OPERATIVE DIAGNOSIS:  Same2  PROCEDURE:  Procedure(s): REPEAT CESAREAN SECTION EDC: 01-06-24 ALLERG: PENICILLINS, SULFA  PREVIOUS X 2 (N/A)  SURGEON:  Surgeons and Role:    * Marcelle Overlie, MD - Primary  PHYSICIAN ASSISTANT:   ASSISTANTS: none   ANESTHESIA:   spinal  EBL:  556 mL   BLOOD ADMINISTERED:none  DRAINS: Urinary Catheter (Foley)   LOCAL MEDICATIONS USED:  NONE  SPECIMEN:  No Specimen  DISPOSITION OF SPECIMEN:  N/A  COUNTS:  YES  TOURNIQUET:  * No tourniquets in log *  DICTATION: .Other Dictation: Dictation Number dictated  PLAN OF CARE: Admit to inpatient   PATIENT DISPOSITION:  PACU - hemodynamically stable.   Delay start of Pharmacological VTE agent (>24hrs) due to surgical blood loss or risk of bleeding: not applicable

## 2023-12-19 NOTE — Anesthesia Procedure Notes (Signed)
 Spinal  Patient location during procedure: OR Start time: 12/19/2023 11:42 AM End time: 12/19/2023 11:47 AM Reason for block: surgical anesthesia Staffing Performed: anesthesiologist  Anesthesiologist: Leonides Grills, MD Performed by: Leonides Grills, MD Authorized by: Leonides Grills, MD   Preanesthetic Checklist Completed: patient identified, IV checked, risks and benefits discussed, surgical consent, monitors and equipment checked, pre-op evaluation and timeout performed Spinal Block Patient position: sitting Prep: DuraPrep Patient monitoring: cardiac monitor, continuous pulse ox and blood pressure Approach: midline Location: L4-5 Injection technique: single-shot Needle Needle type: Pencan  Needle gauge: 24 G Needle length: 9 cm Assessment Sensory level: T10 Events: CSF return Additional Notes Functioning IV was confirmed and monitors were applied. Sterile prep and drape, including hand hygiene and sterile gloves were used. The patient was positioned and the spine was prepped. The skin was anesthetized with lidocaine.  Free flow of clear CSF was obtained prior to injecting local anesthetic into the CSF.  The spinal needle aspirated freely following injection.  The needle was carefully withdrawn.  The patient tolerated the procedure well.

## 2023-12-19 NOTE — Anesthesia Preprocedure Evaluation (Addendum)
 Anesthesia Evaluation  Patient identified by MRN, date of birth, ID band Patient awake    Reviewed: Allergy & Precautions, NPO status , Patient's Chart, lab work & pertinent test results  History of Anesthesia Complications (+) PONV and history of anesthetic complications  Airway Mallampati: II  TM Distance: >3 FB Neck ROM: Full    Dental no notable dental hx.    Pulmonary neg pulmonary ROS   Pulmonary exam normal        Cardiovascular negative cardio ROS Normal cardiovascular exam     Neuro/Psych negative neurological ROS  negative psych ROS   GI/Hepatic negative GI ROS, Neg liver ROS,,,  Endo/Other  diabetes, Insulin DependentHypothyroidism  Class 3 obesityPCOS (polycystic ovarian syndrome)  Renal/GU negative Renal ROS     Musculoskeletal negative musculoskeletal ROS (+)    Abdominal  (+) + obese  Peds  Hematology negative hematology ROS (+)   Anesthesia Other Findings PREVIOUS C-S X 2  Reproductive/Obstetrics (+) Pregnancy                             Anesthesia Physical Anesthesia Plan  ASA: 3  Anesthesia Plan: Spinal   Post-op Pain Management:    Induction:   PONV Risk Score and Plan: 3 and Ondansetron, Dexamethasone, Scopolamine patch - Pre-op and Treatment may vary due to age or medical condition  Airway Management Planned: Natural Airway  Additional Equipment:   Intra-op Plan:   Post-operative Plan:   Informed Consent: I have reviewed the patients History and Physical, chart, labs and discussed the procedure including the risks, benefits and alternatives for the proposed anesthesia with the patient or authorized representative who has indicated his/her understanding and acceptance.     Dental advisory given  Plan Discussed with: CRNA  Anesthesia Plan Comments:        Anesthesia Quick Evaluation

## 2023-12-19 NOTE — Progress Notes (Signed)
 Pharmacy Antibiotic Note  Crystalynn Mcinerney is a 28 y.o. female admitted on 12/19/2023 with surgical prophylaxis.  Pharmacy has been consulted for gentamicin dosing.  Plan: Gentamicin 5mg /kg adj wt IV once for surgical prophylaxis  Height: 5\' 2"  (157.5 cm) Weight: 102 kg (224 lb 13.9 oz) IBW/kg (Calculated) : 50.1  Temp (24hrs), Avg:98.1 F (36.7 C), Min:98.1 F (36.7 C), Max:98.1 F (36.7 C)  Recent Labs  Lab 12/19/23 1009  WBC 11.1*  CREATININE 0.63    Estimated Creatinine Clearance: 116.1 mL/min (by C-G formula based on SCr of 0.63 mg/dL).    Allergies  Allergen Reactions   Penicillins Hives    Pt reports as a child has avoided drug since   Sulfa Antibiotics Hives    Pt reports reaction as a child-has avoided medication since     Thank you for allowing pharmacy to be a part of this patient's care.  Vonzell Schlatter Belen Zwahlen 12/19/2023 12:22 PM

## 2023-12-19 NOTE — Op Note (Signed)
 Melissa Mullins, Melissa Mullins MEDICAL RECORD NO: [id number] ACCOUNT NO: [id number] DATE OF BIRTH: 1996/08/08 FACILITY: [facility] LOCATION: [facility] PHYSICIAN: Marcelino Duster L. Vincente Poli, MD  Operative Report   DATE OF PROCEDURE: 12/19/2023  PREOPERATIVE DIAGNOSES: 1.  Intrauterine pregnancy at 37 weeks and 3 days. 2.  Previous cesarean section x2. 3.  Type 2 diabetes.  POSTOPERATIVE DIAGNOSES: 1.  Intrauterine pregnancy at 37 weeks and 3 days. 2.  Previous cesarean section x2. 3.  Type 2 diabetes.  PROCEDURE:  Repeat low transverse cesarean section.  SURGEON:  Jonet Mathies L. Ahuva Poynor, MD  ANESTHESIA:  Spinal.  ESTIMATED BLOOD LOSS:  556 mL.  COMPLICATIONS:  None.  DRAINS:  Foley.  DESCRIPTION OF PROCEDURE:  The patient was taken to the operating room.  Her spinal was placed without incident.  She was prepped and draped in the usual sterile fashion.  A timeout was performed.  A low transverse incision was made and carried down to  the fascia.  The fascia was scored in the midline and extended laterally.  Rectus muscles were separated in the midline.  The peritoneum was divided.  The peritoneal incision was then stretched.  The bladder flap was then created with sharp and blunt  dissection.  After the uterine incision was made the baby was delivered in cephalic presentation and was delivered easily, was a female infant. Apgar were 8 at 1 minute and 9 at 5 minutes.  The cord was clamped and cut, and the baby was handed to the  waiting neonatal team.  Cord blood was obtained.  The placenta was manually removed and noted to be normal and intact with a three-vessel cord.  The uterus was exteriorized of all clots and debris.  The uterine incision was closed in one layer using 0  chromic in a running lock stitch.  The uterus was returned to the abdomen.  Irrigation was performed.  Hemostasis was noted.  The peritoneum was closed using 0 Vicryl in a running stitch.  The rectus muscles were reapproximated in the midline.   The  fascia was closed using 0 Vicryl starting at one corner and meeting in the midline in a running stitch.  After the fascia was completely closed with a running stitch, the subcuticular tissue was closed with plain gut running, and the skin was closed with  3-0 Vicryl in a subcuticular fashion.  Benzoin, Steri-Strips, and a honeycomb dressing were applied.  All sponge, lap, and instrument counts were correct x2.  The patient went to the recovery room in good condition.   PUS D: 12/19/2023 12:49:39 pm T: 12/19/2023 4:11:00 pm  JOB: [id number]/ [id number]

## 2023-12-19 NOTE — Progress Notes (Signed)
 Patient doing well Proceed with Repeat C Section Sliding scale insulin post op  H and P signed

## 2023-12-20 LAB — CBC
HCT: 34 % — ABNORMAL LOW (ref 36.0–46.0)
Hemoglobin: 11.6 g/dL — ABNORMAL LOW (ref 12.0–15.0)
MCH: 30.7 pg (ref 26.0–34.0)
MCHC: 34.1 g/dL (ref 30.0–36.0)
MCV: 89.9 fL (ref 80.0–100.0)
Platelets: 187 10*3/uL (ref 150–400)
RBC: 3.78 MIL/uL — ABNORMAL LOW (ref 3.87–5.11)
RDW: 13 % (ref 11.5–15.5)
WBC: 16.5 10*3/uL — ABNORMAL HIGH (ref 4.0–10.5)
nRBC: 0 % (ref 0.0–0.2)

## 2023-12-20 LAB — GLUCOSE, CAPILLARY
Glucose-Capillary: 133 mg/dL — ABNORMAL HIGH (ref 70–99)
Glucose-Capillary: 75 mg/dL (ref 70–99)
Glucose-Capillary: 173 mg/dL — ABNORMAL HIGH (ref 70–99)
Glucose-Capillary: 140 mg/dL — ABNORMAL HIGH (ref 70–99)

## 2023-12-20 NOTE — Progress Notes (Signed)
 Postpartum Progress Note  Postpartum Day 1 s/p repeat Cesarean section.  Subjective:  Patient reports no overnight events.  She reports well controlled pain, ambulating without difficulty, voiding spontaneously, tolerating PO.  She reports Positive flatus, Negative BM.  Vaginal bleeding is appropriate.  Objective: Blood pressure 128/76, pulse 74, temperature 98 F (36.7 C), temperature source Oral, resp. rate 18, height 5\' 2"  (1.575 m), weight 102 kg, SpO2 99%, unknown if currently breastfeeding.  Physical Exam:  General: alert and no distress Lochia: appropriate Abdomen: soft, ATTP Uterine Fundus: firm Incision: dressing in place DVT Evaluation: No evidence of DVT seen on physical exam.  Recent Labs    12/19/23 1009 12/20/23 0525  HGB 12.9 11.6*  HCT 37.3 34.0*    Assessment/Plan: Postpartum Day 1, s/p C-section T2DM - SSI postpartum. Fairly well controlled postpartum, discussed importance for DM control for wound healing. Appreciate DM coodinator involvement. Hypothyroidism, continue synthroid Lactation following Baby boy - circ deferred until outpatient for hydroceles and webbing.  Doing well, continue routine postpartum care. Anticipate discharge PPD# 2 or 3   LOS: 1 day   Lyn Henri 12/20/2023, 7:31 AM

## 2023-12-20 NOTE — Inpatient Diabetes Management (Signed)
 Inpatient Diabetes Program Recommendations  Diabetes Treatment Program Recommendations  ADA Standards of Care Diabetes in Pregnancy Target Glucose Ranges:  Fasting: 70 - 95 mg/dL 1 hr postprandial: Less than 140mg /dL (from first bite of meal) 2 hr postprandial: Less than 120 mg/dL (from first bite of meal)    Lab Results  Component Value Date   GLUCAP 133 (H) 12/20/2023   HGBA1C 6.0 (H) 12/19/2023    Review of Glycemic Control  Latest Reference Range & Units 12/19/23 09:51 12/19/23 12:51 12/19/23 19:36 12/19/23 22:54 12/20/23 07:44  Glucose-Capillary 70 - 99 mg/dL 914 (H) 782 (H) 956 (H) 147 (H) 133 (H)   Diabetes history: Type 2 DM Outpatient Diabetes medications: Novolin N 10 units QA, 36 units QP, Novolog 30 units TID Prior to pregnancy: Mounjaro weekly, Synthroid Current orders for Inpatient glycemic control: non Novolog 0-15 units tid Novolog 4 units tid meal coverage  Inpatient Diabetes Program Recommendations:    -   Increase Novolog meal coverage to 6 units tid if pt eating >50% of meals  Sees Dr Talmage Nap, outpatient endocrinology. Sees her every 6 months while not pregnant, sees her Q1 month during pregnancy  Spoke with pt at bedside. Pt reports She has scheduled a follow up appointment postpartum at 4 weeks. Pt also reported making a postpartum plan for her outpatient glucose trends and insulin regimen that consists of being on a sliding scale at home. Informed pt we also have her on meal coverage here and may also prescribe that in addition to sliding scale at home. Discussed meal coverage in detail. Pt is bottle feeding infant.   Thanks, Christena Deem RN, MSN, BC-ADM Inpatient Diabetes Coordinator Team Pager [phone] (8a-5p)

## 2023-12-21 ENCOUNTER — Other Ambulatory Visit: Payer: Self-pay | Admitting: Obstetrics & Gynecology

## 2023-12-21 ENCOUNTER — Telehealth: Payer: Self-pay

## 2023-12-21 DIAGNOSIS — O0993 Supervision of high risk pregnancy, unspecified, third trimester: Secondary | ICD-10-CM

## 2023-12-21 LAB — GLUCOSE, CAPILLARY: Glucose-Capillary: 94 mg/dL (ref 70–99)

## 2023-12-21 MED ORDER — ACETAMINOPHEN 325 MG PO TABS: 650.0000 mg | ORAL_TABLET | Freq: Four times a day (QID) | ORAL | 0 refills | Status: AC | PRN

## 2023-12-21 MED ORDER — IBUPROFEN 600 MG PO TABS
600.0000 mg | ORAL_TABLET | Freq: Four times a day (QID) | ORAL | 0 refills | Status: AC | PRN
Start: 2023-12-21 — End: ?

## 2023-12-21 MED ORDER — OXYCODONE HCL 5 MG PO TABS: 5.0000 mg | ORAL_TABLET | Freq: Four times a day (QID) | ORAL | 0 refills | Status: AC | PRN

## 2023-12-21 NOTE — Telephone Encounter (Signed)
 Mar/sw patient (to schedule MFM referral received) Patient has delivered, appointment not needed.

## 2023-12-21 NOTE — Discharge Summary (Signed)
 Postpartum Discharge Summary  Date of Service updated 12/21/23     Patient Name: Melissa Mullins DOB: [birth year] MRN: [id number]  Date of admission: 12/19/2023 Delivery date:12/19/2023 Delivering provider: Marcelle Overlie Date of discharge: 12/21/2023  Admitting diagnosis: Pregnancy [Z34.90] S/P cesarean section [Z98.891] Intrauterine pregnancy: [redacted]w[redacted]d     Secondary diagnosis:  Principal Problem:   Pregnancy Active Problems:   S/P cesarean section  Additional problems: Type II DM    Discharge diagnosis: Term Pregnancy Delivered and Type 2 DM                                              Post partum procedures: Augmentation: N/A Complications: None  Hospital course: Sceduled C/S   28 y.o. yo G3P3003 at [redacted]w[redacted]d was admitted to the hospital 12/19/2023 for scheduled cesarean section with the following indication:Elective Repeat.Delivery details are as follows:  Membrane Rupture Time/Date: 12:09 PM,12/19/2023  Delivery Method:C-Section, Low Transverse Operative Delivery:N/A Details of operation can be found in separate operative note.  Patient had a postpartum course complicated by.  She is ambulating, tolerating a regular diet, passing flatus, and urinating well. Patient is discharged home in stable condition on  12/21/23        Newborn Data: Birth date:12/19/2023 Birth time:12:09 PM Gender:Female Living status:Living Apgars:8 ,9  Weight:3250 g    Magnesium Sulfate received: No BMZ received: No Rhophylac:No MMR:No T-DaP:Given prenatally Flu:  RSV Vaccine received:  Transfusion:No Immunizations administered: There is no immunization history for the selected administration types on file for this patient.  Physical exam  Vitals:   12/20/23 0537 12/20/23 1431 12/20/23 2010 12/21/23 0534  BP: 128/76 (!) 125/59 128/75 128/80  Pulse: 74 81 84 81  Resp: 18 16 18 16   Temp: 98 F (36.7 C) 98 F (36.7 C) 97.6 F (36.4 C) 98.7 F (37.1 C)  TempSrc: Oral Oral Oral Oral  SpO2: 99% 100%  99% 99%  Weight:      Height:       General: alert, cooperative, and no distress Lochia: appropriate Uterine Fundus: firm Incision: Healing well with no significant drainage DVT Evaluation: No evidence of DVT seen on physical exam. Labs: Lab Results  Component Value Date   WBC 16.5 (H) 12/20/2023   HGB 11.6 (L) 12/20/2023   HCT 34.0 (L) 12/20/2023   MCV 89.9 12/20/2023   PLT 187 12/20/2023      Latest Ref Rng & Units 12/19/2023   10:09 AM  CMP  Glucose 70 - 99 mg/dL 91   BUN 6 - 20 mg/dL 6   Creatinine 1.32 - 4.40 mg/dL 1.02   Sodium 725 - 366 mmol/L 135   Potassium 3.5 - 5.1 mmol/L 3.6   Chloride 98 - 111 mmol/L 104   CO2 22 - 32 mmol/L 20   Calcium 8.9 - 10.3 mg/dL 9.2   Total Protein 6.5 - 8.1 g/dL 6.4   Total Bilirubin 0.0 - 1.2 mg/dL 0.5   Alkaline Phos 38 - 126 U/L 102   AST 15 - 41 U/L 24   ALT 0 - 44 U/L 13    Edinburgh Score:    12/19/2023   10:57 PM  Edinburgh Postnatal Depression Scale Screening Tool  I have been able to laugh and see the funny side of things. 0  I have looked forward with enjoyment to things. 0  I have blamed  myself unnecessarily when things went wrong. 1  I have been anxious or worried for no good reason. 0  I have felt scared or panicky for no good reason. 0  Things have been getting on top of me. 0  I have been so unhappy that I have had difficulty sleeping. 0  I have felt sad or miserable. 0  I have been so unhappy that I have been crying. 0  The thought of harming myself has occurred to me. 0  Edinburgh Postnatal Depression Scale Total 1      After visit meds:  Allergies as of 12/21/2023       Reactions   Penicillins Hives   Pt reports as a child has avoided drug since   Sulfa Antibiotics Hives   Pt reports reaction as a child-has avoided medication since        Medication List     STOP taking these medications    aspirin EC 81 MG tablet       TAKE these medications    acetaminophen 325 MG tablet Commonly  known as: TYLENOL Take 2 tablets (650 mg total) by mouth every 6 (six) hours as needed for mild pain (pain score 1-3) (temperature > 101.5.).   docusate sodium 100 MG capsule Commonly known as: Colace Take 1 capsule (100 mg total) by mouth 2 (two) times daily. What changed:  how much to take when to take this reasons to take this   ibuprofen 600 MG tablet Commonly known as: ADVIL Take 1 tablet (600 mg total) by mouth every 6 (six) hours as needed.   levothyroxine 75 MCG tablet Commonly known as: SYNTHROID Take 75 mcg by mouth daily before breakfast.   multivitamin-prenatal 27-0.8 MG Tabs tablet Take 1 tablet by mouth daily.   NovoLIN N FlexPen 100 UNIT/ML FlexPen Generic drug: Insulin NPH (Human) (Isophane) Inject 10-36 Units into the skin See admin instructions. Inject 10 units in the morning and 36 units in the evening   NovoLOG FlexPen 100 UNIT/ML FlexPen Generic drug: insulin aspart Inject 30 Units into the skin 3 (three) times daily with meals.   oxyCODONE 5 MG immediate release tablet Commonly known as: Oxy IR/ROXICODONE Take 1 tablet (5 mg total) by mouth every 6 (six) hours as needed for moderate pain (pain score 4-6).         Discharge home in stable condition Infant Feeding: Breast Infant Disposition:home with mother Discharge instruction: per After Visit Summary and Postpartum booklet. Activity: Advance as tolerated. Pelvic rest for 6 weeks.  Diet: Carb control Anticipated Birth Control: Unsure Postpartum Appointment:6 weeks Additional Postpartum F/U:  glucose control and thyroid replacement per Dr Talmage Nap, endocrinology Future Appointments: Future Appointments  Date Time Provider Department Center  12/22/2023  9:30 AM [facility] 1 [facility] None   Follow up Visit:      12/21/2023 Leslie Andrea, MD

## 2023-12-22 ENCOUNTER — Encounter (HOSPITAL_COMMUNITY)
Admission: RE | Admit: 2023-12-22 | Discharge: 2023-12-22 | Disposition: A | Discharge status: Home / Self Care | Payer: Self-pay | Source: Ambulatory Visit | Attending: Obstetrics and Gynecology | Admitting: Obstetrics and Gynecology

## 2023-12-22 HISTORY — DX: Nausea with vomiting, unspecified: R11.2

## 2023-12-22 HISTORY — DX: Gestational diabetes mellitus in pregnancy, unspecified control: O24.419

## 2023-12-22 HISTORY — DX: Other specified postprocedural states: Z98.890

## 2023-12-29 ENCOUNTER — Telehealth (HOSPITAL_COMMUNITY): Payer: Self-pay | Admitting: *Deleted

## 2023-12-29 NOTE — Telephone Encounter (Signed)
 12/29/2023  Name: Nylani Michetti MRN: [id number] DOB: 1995/09/17  Reason for Call:  Transition of Care Hospital Discharge Call  Contact Status: Patient Contact Status: Message  Language assistant needed:          Follow-Up Questions:    Van Postnatal Depression Scale:  In the Past 7 Days:    PHQ2-9 Depression Scale:     Discharge Follow-up:    Post-discharge interventions: NA  Mliss Sieve, RN 12/29/2023 13:56

## 2024-01-01 ENCOUNTER — Inpatient Hospital Stay (HOSPITAL_COMMUNITY): Admit: 2024-01-01 | Payer: Self-pay
